# Patient Record
Sex: Female | Born: 1960 | Race: Black or African American | Hispanic: No | Marital: Married | Smoking: Never smoker
Health system: Southern US, Community
[De-identification: ages and names within clinical notes are randomized; demographics above are authoritative.]

## PROBLEM LIST (undated history)

## (undated) DIAGNOSIS — E7800 Pure hypercholesterolemia, unspecified: Secondary | ICD-10-CM

## (undated) DIAGNOSIS — I10 Essential (primary) hypertension: Secondary | ICD-10-CM

## (undated) HISTORY — PX: BREAST EXCISIONAL BIOPSY: SUR124

---

## 1997-11-02 ENCOUNTER — Encounter (HOSPITAL_COMMUNITY): Admission: RE | Admit: 1997-11-02 | Discharge: 1998-01-31 | Payer: Self-pay | Admitting: *Deleted

## 1998-01-29 ENCOUNTER — Inpatient Hospital Stay (HOSPITAL_COMMUNITY): Admission: AD | Admit: 1998-01-29 | Discharge: 1998-01-29 | Payer: Self-pay | Admitting: *Deleted

## 1998-02-01 ENCOUNTER — Encounter (HOSPITAL_COMMUNITY): Admission: RE | Admit: 1998-02-01 | Discharge: 1998-04-27 | Payer: Self-pay | Admitting: *Deleted

## 1998-03-21 ENCOUNTER — Inpatient Hospital Stay (HOSPITAL_COMMUNITY): Admission: AD | Admit: 1998-03-21 | Discharge: 1998-03-21 | Payer: Self-pay | Admitting: Obstetrics

## 1998-04-25 ENCOUNTER — Inpatient Hospital Stay (HOSPITAL_COMMUNITY): Admission: AD | Admit: 1998-04-25 | Discharge: 1998-04-29 | Payer: Self-pay | Admitting: *Deleted

## 1998-05-02 ENCOUNTER — Inpatient Hospital Stay (HOSPITAL_COMMUNITY): Admission: AD | Admit: 1998-05-02 | Discharge: 1998-05-04 | Payer: Self-pay | Admitting: *Deleted

## 1998-05-04 ENCOUNTER — Encounter (HOSPITAL_COMMUNITY): Admission: RE | Admit: 1998-05-04 | Discharge: 1998-08-02 | Payer: Self-pay | Admitting: *Deleted

## 1998-06-03 ENCOUNTER — Inpatient Hospital Stay (HOSPITAL_COMMUNITY): Admission: AD | Admit: 1998-06-03 | Discharge: 1998-06-03 | Payer: Self-pay | Admitting: Obstetrics & Gynecology

## 1998-06-21 ENCOUNTER — Inpatient Hospital Stay (HOSPITAL_COMMUNITY): Admission: AD | Admit: 1998-06-21 | Discharge: 1998-06-21 | Payer: Self-pay | Admitting: *Deleted

## 2002-09-18 ENCOUNTER — Ambulatory Visit (HOSPITAL_COMMUNITY): Admission: RE | Admit: 2002-09-18 | Discharge: 2002-09-18 | Payer: Self-pay | Admitting: Internal Medicine

## 2002-09-18 ENCOUNTER — Encounter: Payer: Self-pay | Admitting: Internal Medicine

## 2003-03-04 ENCOUNTER — Encounter: Admission: RE | Admit: 2003-03-04 | Discharge: 2003-03-04 | Payer: Self-pay | Admitting: Internal Medicine

## 2003-03-04 ENCOUNTER — Encounter: Payer: Self-pay | Admitting: Internal Medicine

## 2003-04-13 ENCOUNTER — Other Ambulatory Visit: Admission: RE | Admit: 2003-04-13 | Discharge: 2003-04-13 | Payer: Self-pay | Admitting: Obstetrics and Gynecology

## 2004-02-21 ENCOUNTER — Emergency Department (HOSPITAL_COMMUNITY): Admission: EM | Admit: 2004-02-21 | Discharge: 2004-02-21 | Payer: Self-pay | Admitting: Family Medicine

## 2004-08-14 ENCOUNTER — Encounter: Admission: RE | Admit: 2004-08-14 | Discharge: 2004-08-14 | Payer: Self-pay | Admitting: Obstetrics and Gynecology

## 2004-08-31 ENCOUNTER — Encounter: Admission: RE | Admit: 2004-08-31 | Discharge: 2004-10-16 | Payer: Self-pay | Admitting: Nurse Practitioner

## 2005-10-02 ENCOUNTER — Encounter: Admission: RE | Admit: 2005-10-02 | Discharge: 2005-10-02 | Payer: Self-pay | Admitting: Obstetrics and Gynecology

## 2007-03-20 ENCOUNTER — Encounter: Admission: RE | Admit: 2007-03-20 | Discharge: 2007-03-20 | Payer: Self-pay | Admitting: Internal Medicine

## 2007-10-19 ENCOUNTER — Emergency Department (HOSPITAL_COMMUNITY): Admission: EM | Admit: 2007-10-19 | Discharge: 2007-10-19 | Payer: Self-pay | Admitting: Emergency Medicine

## 2008-03-23 ENCOUNTER — Encounter: Admission: RE | Admit: 2008-03-23 | Discharge: 2008-03-23 | Payer: Self-pay | Admitting: Internal Medicine

## 2008-10-14 ENCOUNTER — Emergency Department (HOSPITAL_COMMUNITY): Admission: EM | Admit: 2008-10-14 | Discharge: 2008-10-14 | Payer: Self-pay | Admitting: Family Medicine

## 2008-11-05 ENCOUNTER — Ambulatory Visit (HOSPITAL_COMMUNITY): Admission: RE | Admit: 2008-11-05 | Discharge: 2008-11-05 | Payer: Self-pay | Admitting: General Surgery

## 2008-11-09 ENCOUNTER — Encounter: Admission: RE | Admit: 2008-11-09 | Discharge: 2008-11-09 | Payer: Self-pay | Admitting: Internal Medicine

## 2008-11-30 ENCOUNTER — Ambulatory Visit (HOSPITAL_COMMUNITY): Admission: RE | Admit: 2008-11-30 | Discharge: 2008-11-30 | Payer: Self-pay | Admitting: Gastroenterology

## 2008-11-30 ENCOUNTER — Encounter (INDEPENDENT_AMBULATORY_CARE_PROVIDER_SITE_OTHER): Payer: Self-pay | Admitting: Gastroenterology

## 2009-01-20 ENCOUNTER — Ambulatory Visit (HOSPITAL_COMMUNITY): Admission: RE | Admit: 2009-01-20 | Discharge: 2009-01-20 | Payer: Self-pay | Admitting: General Surgery

## 2009-01-20 ENCOUNTER — Encounter (INDEPENDENT_AMBULATORY_CARE_PROVIDER_SITE_OTHER): Payer: Self-pay | Admitting: General Surgery

## 2009-04-01 ENCOUNTER — Encounter: Admission: RE | Admit: 2009-04-01 | Discharge: 2009-04-01 | Payer: Self-pay | Admitting: Obstetrics and Gynecology

## 2009-07-26 ENCOUNTER — Ambulatory Visit (HOSPITAL_COMMUNITY): Admission: RE | Admit: 2009-07-26 | Discharge: 2009-07-26 | Payer: Self-pay | Admitting: Orthopedic Surgery

## 2009-09-05 ENCOUNTER — Encounter: Admission: RE | Admit: 2009-09-05 | Discharge: 2009-09-30 | Payer: Self-pay | Admitting: Orthopedic Surgery

## 2009-10-03 ENCOUNTER — Encounter: Admission: RE | Admit: 2009-10-03 | Discharge: 2009-10-18 | Payer: Self-pay | Admitting: Orthopedic Surgery

## 2010-04-05 ENCOUNTER — Encounter: Admission: RE | Admit: 2010-04-05 | Discharge: 2010-04-05 | Payer: Self-pay | Admitting: Obstetrics and Gynecology

## 2010-10-19 ENCOUNTER — Other Ambulatory Visit: Payer: Self-pay | Admitting: Obstetrics and Gynecology

## 2011-01-10 LAB — CBC
HCT: 35.2 % — ABNORMAL LOW (ref 36.0–46.0)
Hemoglobin: 12.2 g/dL (ref 12.0–15.0)
MCHC: 34.7 g/dL (ref 30.0–36.0)
MCV: 85.5 fL (ref 78.0–100.0)
Platelets: 295 10*3/uL (ref 150–400)
RBC: 4.12 MIL/uL (ref 3.87–5.11)
RDW: 19.2 % — ABNORMAL HIGH (ref 11.5–15.5)
WBC: 6.8 10*3/uL (ref 4.0–10.5)

## 2011-01-10 LAB — COMPREHENSIVE METABOLIC PANEL
ALT: 18 U/L (ref 0–35)
AST: 22 U/L (ref 0–37)
Albumin: 4.2 g/dL (ref 3.5–5.2)
Alkaline Phosphatase: 57 U/L (ref 39–117)
BUN: 8 mg/dL (ref 6–23)
CO2: 31 mEq/L (ref 19–32)
Calcium: 9.7 mg/dL (ref 8.4–10.5)
Chloride: 103 mEq/L (ref 96–112)
Creatinine, Ser: 0.77 mg/dL (ref 0.4–1.2)
GFR calc Af Amer: >60 mL/min (ref >60)
GFR calc non Af Amer: >60 mL/min (ref >60)
Glucose, Bld: 78 mg/dL (ref 70–99)
Potassium: 3.8 mEq/L (ref 3.5–5.1)
Sodium: 141 mEq/L (ref 135–145)
Total Bilirubin: 0.2 mg/dL — ABNORMAL LOW (ref 0.3–1.2)
Total Protein: 6.8 g/dL (ref 6.0–8.3)

## 2011-01-10 LAB — DIFFERENTIAL
Basophils Absolute: 0 10*3/uL (ref 0.0–0.1)
Basophils Relative: 1 % (ref 0–1)
Eosinophils Absolute: 0.1 10*3/uL (ref 0.0–0.7)
Eosinophils Relative: 2 % (ref 0–5)
Lymphocytes Relative: 26 % (ref 12–46)
Lymphs Abs: 1.8 10*3/uL (ref 0.7–4.0)
Monocytes Absolute: 0.4 10*3/uL (ref 0.1–1.0)
Monocytes Relative: 5 % (ref 3–12)
Neutro Abs: 4.5 10*3/uL (ref 1.7–7.7)
Neutrophils Relative %: 66 % (ref 43–77)

## 2011-01-15 LAB — COMPREHENSIVE METABOLIC PANEL
ALT: 20 U/L (ref 0–35)
AST: 26 U/L (ref 0–37)
Albumin: 4.4 g/dL (ref 3.5–5.2)
Alkaline Phosphatase: 55 U/L (ref 39–117)
BUN: 9 mg/dL (ref 6–23)
CO2: 25 mEq/L (ref 19–32)
Calcium: 9.6 mg/dL (ref 8.4–10.5)
Chloride: 108 mEq/L (ref 96–112)
Creatinine, Ser: 0.71 mg/dL (ref 0.4–1.2)
GFR calc Af Amer: >60 mL/min (ref >60)
GFR calc non Af Amer: >60 mL/min (ref >60)
Glucose, Bld: 90 mg/dL (ref 70–99)
Potassium: 3.9 mEq/L (ref 3.5–5.1)
Sodium: 140 mEq/L (ref 135–145)
Total Bilirubin: 0.7 mg/dL (ref 0.3–1.2)
Total Protein: 8 g/dL (ref 6.0–8.3)

## 2011-01-15 LAB — CBC
HCT: 29.8 % — ABNORMAL LOW (ref 36.0–46.0)
Hemoglobin: 9.6 g/dL — ABNORMAL LOW (ref 12.0–15.0)
MCHC: 32.2 g/dL (ref 30.0–36.0)
MCV: 70.1 fL — ABNORMAL LOW (ref 78.0–100.0)
Platelets: 340 10*3/uL (ref 150–400)
RBC: 4.25 MIL/uL (ref 3.87–5.11)
RDW: 17.9 % — ABNORMAL HIGH (ref 11.5–15.5)
WBC: 7.3 10*3/uL (ref 4.0–10.5)

## 2011-01-15 LAB — DIFFERENTIAL
Basophils Absolute: 0.1 10*3/uL (ref 0.0–0.1)
Basophils Relative: 1 % (ref 0–1)
Eosinophils Absolute: 0.1 10*3/uL (ref 0.0–0.7)
Eosinophils Relative: 1 % (ref 0–5)
Lymphocytes Relative: 21 % (ref 12–46)
Lymphs Abs: 1.5 10*3/uL (ref 0.7–4.0)
Monocytes Absolute: 0.5 10*3/uL (ref 0.1–1.0)
Monocytes Relative: 7 % (ref 3–12)
Neutro Abs: 5.1 10*3/uL (ref 1.7–7.7)
Neutrophils Relative %: 70 % (ref 43–77)

## 2011-01-15 LAB — POCT PREGNANCY, URINE: Preg Test, Ur: NEGATIVE

## 2011-01-15 LAB — POCT URINALYSIS DIP (DEVICE)
Glucose, UA: NEGATIVE mg/dL
Hgb urine dipstick: NEGATIVE
Ketones, ur: 15 mg/dL — AB
Nitrite: NEGATIVE
Protein, ur: 30 mg/dL — AB
Specific Gravity, Urine: 1.02 (ref 1.005–1.030)
Urobilinogen, UA: 0.2 mg/dL (ref 0.0–1.0)
pH: 6 (ref 5.0–8.0)

## 2011-01-15 LAB — LIPASE, BLOOD: Lipase: 25 U/L (ref 11–59)

## 2011-01-16 LAB — DIFFERENTIAL
Basophils Absolute: 0.1 10*3/uL (ref 0.0–0.1)
Basophils Relative: 1 % (ref 0–1)
Eosinophils Absolute: 0.1 10*3/uL (ref 0.0–0.7)
Eosinophils Relative: 1 % (ref 0–5)
Lymphocytes Relative: 30 % (ref 12–46)
Lymphs Abs: 1.7 10*3/uL (ref 0.7–4.0)
Monocytes Absolute: 0.5 10*3/uL (ref 0.1–1.0)
Monocytes Relative: 9 % (ref 3–12)
Neutro Abs: 3.4 10*3/uL (ref 1.7–7.7)
Neutrophils Relative %: 59 % (ref 43–77)

## 2011-01-16 LAB — CBC
HCT: 27 % — ABNORMAL LOW (ref 36.0–46.0)
Hemoglobin: 8.8 g/dL — ABNORMAL LOW (ref 12.0–15.0)
MCHC: 32.8 g/dL (ref 30.0–36.0)
MCV: 70.1 fL — ABNORMAL LOW (ref 78.0–100.0)
Platelets: 426 10*3/uL — ABNORMAL HIGH (ref 150–400)
RBC: 3.85 MIL/uL — ABNORMAL LOW (ref 3.87–5.11)
RDW: 19.2 % — ABNORMAL HIGH (ref 11.5–15.5)
WBC: 5.8 10*3/uL (ref 4.0–10.5)

## 2011-01-16 LAB — COMPREHENSIVE METABOLIC PANEL
ALT: 13 U/L (ref 0–35)
AST: 24 U/L (ref 0–37)
Albumin: 4 g/dL (ref 3.5–5.2)
Alkaline Phosphatase: 52 U/L (ref 39–117)
BUN: 7 mg/dL (ref 6–23)
CO2: 24 mEq/L (ref 19–32)
Calcium: 9.7 mg/dL (ref 8.4–10.5)
Chloride: 105 mEq/L (ref 96–112)
Creatinine, Ser: 0.59 mg/dL (ref 0.4–1.2)
GFR calc Af Amer: >60 mL/min (ref >60)
GFR calc non Af Amer: >60 mL/min (ref >60)
Glucose, Bld: 81 mg/dL (ref 70–99)
Potassium: 4.5 mEq/L (ref 3.5–5.1)
Sodium: 138 mEq/L (ref 135–145)
Total Bilirubin: 0.6 mg/dL (ref 0.3–1.2)
Total Protein: 7.2 g/dL (ref 6.0–8.3)

## 2011-01-16 LAB — HEMOGLOBIN AND HEMATOCRIT, BLOOD
HCT: 25.1 % — ABNORMAL LOW (ref 36.0–46.0)
Hemoglobin: 8 g/dL — ABNORMAL LOW (ref 12.0–15.0)

## 2011-02-13 NOTE — Op Note (Signed)
NAMEFARRIE, SANN               ACCOUNT NO.:  [id number]   MEDICAL RECORD NO.:  [id number]          PATIENT TYPE:  AMB   LOCATION:  ENDO                         FACILITY:  [facility]   PHYSICIAN:  Petra Kuba, M.D.    DATE OF BIRTH:  August 19, 1961   DATE OF PROCEDURE:  DATE OF DISCHARGE:                               OPERATIVE REPORT   PROCEDURE:  Colonoscopy.   INDICATION:  The patient with gallstones, microcytic anemia on preop GI  workup.  Consent was signed after risks, benefits, methods, and options  thoroughly discussed in the office prior to sedation.   MEDICINES USED:  1. Fentanyl 75 mcg.  2. Versed 8 mg.   PROCEDURE:  Rectal inspection is pertinent for external hemorrhoids,  small.  Digital exam was negative.  The video Pediatric colonoscope was  inserted, then with abdominal pressure fairly easily advanced around the  colon to the cecum.  No signs of bleeding or abnormality was seen on  insertion.  The cecum was identified by the appendiceal orifice in the  ileocecal valve.  Proctoscope was inserted short ways in the terminal  ileum, which was normal.  Through documentation was obtained.  The scope  was slowly withdrawn.  On slow withdrawal through the colon, no signs of  bleeding or abnormality were seen until we  withdrew back to the distal  sigmoid where a few tiny hyperplastic-appearing polys were seen and were  cold biopsied.  Once back in the rectum, anorectal pull-through and  retroflexion confirmed some small hemorrhoids.  Scope was drained and  readvanced towards the left side of the colon.  Air was suctioned and  scope removed.  The patient tolerated the procedure well.  There was no  obvious immediate complication.   ENDOSCOPIC DIAGNOSES:  1. Internal and external small hemorrhoids.  2. Tiny hyperplastic-appearing distal sigmoid polyp with cold biopsy.  3. Otherwise within normal limits to the terminal ileum without any      signs of bleeding.   PLAN:   Await pathology.  Based on one grandmother with colon cancer,  might repeat colon screening in 5 years and continue workup with an EGD.  Please see that dictation for further workup, plans and recommendations.           ______________________________  Petra Kuba, M.D.     MEM/MEDQ  D:  11/30/2008  T:  12/01/2008  Job:  [id number]   cc:   Merlene Laughter. Renae Gloss, M.D.  Cherylynn Ridges, M.D.

## 2011-02-13 NOTE — Op Note (Signed)
Helen Shaw, Helen Shaw               ACCOUNT NO.:  [id number]   MEDICAL RECORD NO.:  [id number]          PATIENT TYPE:  AMB   LOCATION:  SDS                          FACILITY:  [facility]   PHYSICIAN:  Cherylynn Ridges, M.D.    DATE OF BIRTH:  1961/04/02   DATE OF PROCEDURE:  01/20/2009  DATE OF DISCHARGE:  01/20/2009                               OPERATIVE REPORT   PREOPERATIVE DIAGNOSIS:  Symptomatic cholelithiasis.   POSTOPERATIVE DIAGNOSIS:  Symptomatic cholelithiasis.   PROCEDURE:  Laparoscopic cholecystectomy with cholangiogram.   SURGEON:  Cherylynn Ridges, MD   ASSISTANT:  Magnus Ivan, RNFA   ANESTHESIA:  General endotracheal.   ESTIMATED BLOOD LOSS:  Less than 10 mL.   COMPLICATIONS:  None.   CONDITION:  Stable.   FINDINGS:  Normal intraoperative cholangiogram and adhesions to the  gallbladder.   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   DESCRIPTION FOR PROCEDURE:  The patient was taken to the operating room,  placed on table in the supine position.  After an adequate general  endotracheal anesthetic was administered, she was prepped and draped in  the usual sterile manner exposing the midline in right upper quadrant.   A supraumbilical midline using was made using a #15 blade and taken down  to the midline fascia.  We grabbed the fascia with 2 Kocher clamps and  then incised between the clamps using a 15 blade into the preperitoneal  space.  We then dissected down through the preperitoneal space into the  peritoneal cavity.  Once we entered the peritoneal cavity, a pursestring  suture of 0 Vicryl was passed around the fascial opening and then a  Hasson cannula was passed into the peritoneal cavity securing it in  place.  We insufflated carbon dioxide up to a maximal intraabdominal  pressure of 50 mmHg.  We then passed 2 right costal margin 5-mm cannulas  under direct vision and a subxiphoid 5-mm cannula under direct vision.  The patient was placed in reverse Trendelenburg and the left side was  tilted down.   We were able to grasp the gallbladder and retract it towards the right  upper quadrant and the anterior abdominal wall with a ratcheted grasper  through the lateral most cannula site.  A second grabber was placed on  the infundibulum, but there were effusion to the duodenum and the  surrounding soft tissue, which we dissected away carefully using  electrocautery.   We were able to dissect out the peritoneum overlying the triangle of  Calot and hepatoduodenal triangle isolating the cystic duct and the  cystic artery.  We placed the clip along the gallbladder site of the  cystic duct and 4 clips along the cystic artery proximally and distally.  We made a cholecystodochotomy using laparoscopic scissors, then  performed a cholangiogram using a cook catheter, which had been passed  through the anterior abdominal wall.  The cholangiogram showed good  flowing to the duodenum, the proximal filling, no filling defects, no  biliary ductal  diltation.  Once the cholangiogram was completed, the  clips securing it in place was removed.  We then clipped these distal  cystic duct x3, transected the cystic duct, and then transected the  cystic artery.  We then dissected out the gallbladder from its bed with  minimal difficulty.  We used an EndoCatch bag to remove it from the  supraumbilical site.   A small amount of bilious drainage from the open gallbladder during  procedure, which was drained those minimal bleeding from the gallbladder  bed.   We irrigated with saline solution and then aspirated all fluid and gas  from above the liver and then removed all cannulas.   A 0.25% Marcaine with epinephrine was injected at all sites.  All sites  were closed with Dermabond and Tegaderm.  All needle counts, sponge  counts, and instrument counts were  correct.      Cherylynn Ridges, M.D.  Electronically Signed     JOW/MEDQ  D:  01/20/2009  T:  01/21/2009  Job:  [id number]   cc:   Petra Kuba, M.D.

## 2011-02-13 NOTE — Op Note (Signed)
NAMEJADALEE, Helen Shaw               ACCOUNT NO.:  [id number]   MEDICAL RECORD NO.:  [id number]          PATIENT TYPE:  AMB   LOCATION:  ENDO                         FACILITY:  [facility]   PHYSICIAN:  Petra Kuba, M.D.    DATE OF BIRTH:  [birth year]   DATE OF PROCEDURE:  11/30/2008  DATE OF DISCHARGE:                               OPERATIVE REPORT   PROCEDURE:  Esophagogastroduodenoscopy with biopsy.   INDICATION:  Anemia, right upper quadrant pain, and some reflux.  Consent was signed after risks, benefits, methods, and options were  thoroughly discussed prior to any sedation given.   ADDITIONAL MEDICINE FOR THIS PROCEDURE:  1. Fentanyl 25 mcg.  2. Versed 2 mg.   PROCEDURE:  The videoendoscope was inserted by direct vision.  Her  esophagus was normal.  There was no obvious hiatal hernia or signs of  esophagitis.  Scope passed into the stomach, and advanced through a  normal antrum, normal pylorus, into a normal duodenal bulb, and around  the C-loop to a normal second portion of the duodenum.  No blood was  seen distally.  We went ahead and took 2 biopsies of the duodenum to  rule out sprue or other malabsorption issues.  The scope was slowly  withdrawn back to the bulb, which again was normal.  The scope was  withdrawn back to the stomach and retroflexed.  Angularis, cardia,  fundus, lesser and greater curve were evaluated on retroflex and then  straight visualization without abnormalities.  Air was suctioned.  The  scope was slowly withdrawn.  Again a good look at the esophagus was  normal.  The scope was removed.  The patient tolerated the procedure  adequately.  There was no obvious immediate complication.   ENDOSCOPIC DIAGNOSES:  Essentially normal esophagogastroduodenoscopy  status post duodenal biopsy to rule out sprue or malabsorption of iron  issues.   PLAN:  Await pathology.  Okay with me to proceed with lap chole.  Follow  up with me in 6 weeks after lap chole to recheck  CBC, guiacs off iron,  and make sure no further workup plans are needed, but okay to proceed  with surgery when hemoglobin increases on iron.           ______________________________  Petra Kuba, M.D.     MEM/MEDQ  D:  11/30/2008  T:  12/01/2008  Job:  [id number]   cc:   Merlene Laughter. Renae Gloss, M.D.  Cherylynn Ridges, M.D.

## 2011-06-15 ENCOUNTER — Other Ambulatory Visit: Payer: Self-pay | Admitting: Internal Medicine

## 2011-06-15 DIAGNOSIS — Z1231 Encounter for screening mammogram for malignant neoplasm of breast: Secondary | ICD-10-CM

## 2011-06-20 ENCOUNTER — Ambulatory Visit: Payer: Self-pay

## 2011-06-21 ENCOUNTER — Ambulatory Visit: Payer: Self-pay

## 2011-06-28 ENCOUNTER — Ambulatory Visit
Admission: RE | Admit: 2011-06-28 | Discharge: 2011-06-28 | Disposition: A | Discharge status: Home / Self Care | Payer: 59 | Source: Ambulatory Visit | Attending: Internal Medicine | Admitting: Internal Medicine

## 2011-06-28 DIAGNOSIS — Z1231 Encounter for screening mammogram for malignant neoplasm of breast: Secondary | ICD-10-CM

## 2012-07-28 ENCOUNTER — Other Ambulatory Visit: Payer: Self-pay | Admitting: Internal Medicine

## 2012-07-28 DIAGNOSIS — Z1231 Encounter for screening mammogram for malignant neoplasm of breast: Secondary | ICD-10-CM

## 2012-08-26 ENCOUNTER — Ambulatory Visit
Admission: RE | Admit: 2012-08-26 | Discharge: 2012-08-26 | Disposition: A | Discharge status: Home / Self Care | Payer: 59 | Source: Ambulatory Visit | Attending: Internal Medicine | Admitting: Internal Medicine

## 2012-08-26 DIAGNOSIS — Z1231 Encounter for screening mammogram for malignant neoplasm of breast: Secondary | ICD-10-CM

## 2012-08-29 ENCOUNTER — Other Ambulatory Visit: Payer: Self-pay | Admitting: Internal Medicine

## 2012-08-29 DIAGNOSIS — R928 Other abnormal and inconclusive findings on diagnostic imaging of breast: Secondary | ICD-10-CM

## 2012-09-09 ENCOUNTER — Other Ambulatory Visit: Payer: 59

## 2012-09-10 ENCOUNTER — Ambulatory Visit
Admission: RE | Admit: 2012-09-10 | Discharge: 2012-09-10 | Disposition: A | Discharge status: Home / Self Care | Payer: 59 | Source: Ambulatory Visit | Attending: Internal Medicine | Admitting: Internal Medicine

## 2012-09-10 DIAGNOSIS — R928 Other abnormal and inconclusive findings on diagnostic imaging of breast: Secondary | ICD-10-CM

## 2013-08-26 ENCOUNTER — Other Ambulatory Visit: Payer: Self-pay

## 2013-08-26 DIAGNOSIS — Z1231 Encounter for screening mammogram for malignant neoplasm of breast: Secondary | ICD-10-CM

## 2013-10-07 ENCOUNTER — Ambulatory Visit
Admission: RE | Admit: 2013-10-07 | Discharge: 2013-10-07 | Disposition: A | Discharge status: Home / Self Care | Payer: 59 | Source: Ambulatory Visit

## 2013-10-07 DIAGNOSIS — Z1231 Encounter for screening mammogram for malignant neoplasm of breast: Secondary | ICD-10-CM

## 2014-01-27 ENCOUNTER — Other Ambulatory Visit: Payer: Self-pay | Admitting: Family

## 2014-01-27 DIAGNOSIS — S060XAA Concussion with loss of consciousness status unknown, initial encounter: Secondary | ICD-10-CM

## 2014-01-27 DIAGNOSIS — S060X9A Concussion with loss of consciousness of unspecified duration, initial encounter: Secondary | ICD-10-CM

## 2014-01-29 ENCOUNTER — Other Ambulatory Visit: Payer: 59

## 2014-03-19 ENCOUNTER — Other Ambulatory Visit: Payer: Self-pay | Admitting: Gastroenterology

## 2014-10-26 ENCOUNTER — Ambulatory Visit
Admission: RE | Admit: 2014-10-26 | Discharge: 2014-10-26 | Disposition: A | Discharge status: Home / Self Care | Payer: 59 | Source: Ambulatory Visit | Attending: Internal Medicine | Admitting: Internal Medicine

## 2014-10-26 ENCOUNTER — Other Ambulatory Visit: Payer: Self-pay | Admitting: Internal Medicine

## 2014-10-26 DIAGNOSIS — M25552 Pain in left hip: Principal | ICD-10-CM

## 2014-10-26 DIAGNOSIS — M25551 Pain in right hip: Secondary | ICD-10-CM

## 2014-11-23 ENCOUNTER — Other Ambulatory Visit: Payer: Self-pay

## 2014-11-23 DIAGNOSIS — Z1231 Encounter for screening mammogram for malignant neoplasm of breast: Secondary | ICD-10-CM

## 2014-11-29 ENCOUNTER — Encounter (INDEPENDENT_AMBULATORY_CARE_PROVIDER_SITE_OTHER): Payer: Self-pay

## 2014-11-29 ENCOUNTER — Ambulatory Visit
Admission: RE | Admit: 2014-11-29 | Discharge: 2014-11-29 | Disposition: A | Discharge status: Home / Self Care | Payer: 59 | Source: Ambulatory Visit

## 2014-11-29 DIAGNOSIS — Z1231 Encounter for screening mammogram for malignant neoplasm of breast: Secondary | ICD-10-CM

## 2015-10-07 MED FILL — FUSION PLUS CAPSULE: 30 days supply | Qty: 30 | Fill #3

## 2015-10-07 MED FILL — AMLODIPINE BESYLATE 2.5 MG: 2.5 | 90 days supply | Qty: 90 | Fill #3

## 2015-10-11 DIAGNOSIS — R109 Unspecified abdominal pain: Secondary | ICD-10-CM | POA: Diagnosis not present

## 2015-10-11 DIAGNOSIS — Z01419 Encounter for gynecological examination (general) (routine) without abnormal findings: Secondary | ICD-10-CM | POA: Diagnosis not present

## 2015-10-11 DIAGNOSIS — Z1151 Encounter for screening for human papillomavirus (HPV): Secondary | ICD-10-CM | POA: Diagnosis not present

## 2015-10-13 DIAGNOSIS — N914 Secondary oligomenorrhea: Secondary | ICD-10-CM | POA: Diagnosis not present

## 2015-11-07 MED FILL — FUSION PLUS CAPSULE: 30 days supply | Qty: 30 | Fill #4

## 2015-11-11 DIAGNOSIS — E559 Vitamin D deficiency, unspecified: Secondary | ICD-10-CM | POA: Diagnosis not present

## 2015-11-11 DIAGNOSIS — I10 Essential (primary) hypertension: Secondary | ICD-10-CM | POA: Diagnosis not present

## 2015-11-11 DIAGNOSIS — D649 Anemia, unspecified: Secondary | ICD-10-CM | POA: Diagnosis not present

## 2015-11-11 DIAGNOSIS — E785 Hyperlipidemia, unspecified: Secondary | ICD-10-CM | POA: Diagnosis not present

## 2015-11-11 DIAGNOSIS — J309 Allergic rhinitis, unspecified: Secondary | ICD-10-CM | POA: Diagnosis not present

## 2015-11-11 DIAGNOSIS — Z0000 Encounter for general adult medical examination without abnormal findings: Secondary | ICD-10-CM | POA: Diagnosis not present

## 2015-12-05 MED FILL — LOSARTAN POTASSIUM 100 MG T: 100 | 90 days supply | Qty: 90 | Fill #1

## 2015-12-05 MED FILL — FUSION PLUS CAPSULE: 30 days supply | Qty: 30 | Fill #5

## 2016-01-04 MED FILL — AMLODIPINE BESYLATE 2.5 MG: 2.5 | 90 days supply | Qty: 90 | Fill #0

## 2016-01-27 MED FILL — FUSION PLUS CAPSULE: 30 days supply | Qty: 30 | Fill #6

## 2016-02-17 ENCOUNTER — Other Ambulatory Visit: Payer: Self-pay

## 2016-02-17 DIAGNOSIS — Z1231 Encounter for screening mammogram for malignant neoplasm of breast: Secondary | ICD-10-CM

## 2016-03-05 ENCOUNTER — Other Ambulatory Visit: Payer: Self-pay | Admitting: Internal Medicine

## 2016-03-05 ENCOUNTER — Ambulatory Visit
Admission: RE | Admit: 2016-03-05 | Discharge: 2016-03-05 | Disposition: A | Discharge status: Home / Self Care | Payer: 59 | Source: Ambulatory Visit

## 2016-03-05 DIAGNOSIS — Z1231 Encounter for screening mammogram for malignant neoplasm of breast: Secondary | ICD-10-CM

## 2016-03-12 MED FILL — LOSARTAN POTASSIUM 100 MG T: 100 | 90 days supply | Qty: 90 | Fill #2

## 2016-03-12 MED FILL — FUSION PLUS CAPSULE: 30 days supply | Qty: 30 | Fill #7

## 2016-03-21 DIAGNOSIS — Z113 Encounter for screening for infections with a predominantly sexual mode of transmission: Secondary | ICD-10-CM | POA: Diagnosis not present

## 2016-03-21 DIAGNOSIS — Z1159 Encounter for screening for other viral diseases: Secondary | ICD-10-CM | POA: Diagnosis not present

## 2016-03-21 DIAGNOSIS — Z114 Encounter for screening for human immunodeficiency virus [HIV]: Secondary | ICD-10-CM | POA: Diagnosis not present

## 2016-03-22 DIAGNOSIS — Z113 Encounter for screening for infections with a predominantly sexual mode of transmission: Secondary | ICD-10-CM | POA: Diagnosis not present

## 2016-03-22 DIAGNOSIS — Z118 Encounter for screening for other infectious and parasitic diseases: Secondary | ICD-10-CM | POA: Diagnosis not present

## 2016-04-02 MED FILL — AMLODIPINE BESYLATE 2.5 MG: 2.5 | 90 days supply | Qty: 90 | Fill #1

## 2016-04-24 DIAGNOSIS — I10 Essential (primary) hypertension: Secondary | ICD-10-CM | POA: Diagnosis not present

## 2016-04-24 DIAGNOSIS — H2513 Age-related nuclear cataract, bilateral: Secondary | ICD-10-CM | POA: Diagnosis not present

## 2016-04-24 DIAGNOSIS — H4321 Crystalline deposits in vitreous body, right eye: Secondary | ICD-10-CM | POA: Diagnosis not present

## 2016-04-24 DIAGNOSIS — H524 Presbyopia: Secondary | ICD-10-CM | POA: Diagnosis not present

## 2016-06-01 MED FILL — FUSION PLUS CAPSULE: 30 days supply | Qty: 30 | Fill #0

## 2016-06-18 DIAGNOSIS — N959 Unspecified menopausal and perimenopausal disorder: Secondary | ICD-10-CM | POA: Diagnosis not present

## 2016-06-18 DIAGNOSIS — I10 Essential (primary) hypertension: Secondary | ICD-10-CM | POA: Diagnosis not present

## 2016-06-18 DIAGNOSIS — E785 Hyperlipidemia, unspecified: Secondary | ICD-10-CM | POA: Diagnosis not present

## 2016-06-18 DIAGNOSIS — R749 Abnormal serum enzyme level, unspecified: Secondary | ICD-10-CM | POA: Diagnosis not present

## 2016-06-19 MED FILL — LOSARTAN POTASSIUM 100 MG T: 100 | 90 days supply | Qty: 90 | Fill #0

## 2016-07-16 MED FILL — AMLODIPINE BESYLATE 2.5 MG: 2.5 | 90 days supply | Qty: 90 | Fill #2

## 2016-07-30 MED FILL — FUSION PLUS CAPSULE: 30 days supply | Qty: 30 | Fill #1

## 2016-08-15 DIAGNOSIS — R102 Pelvic and perineal pain: Secondary | ICD-10-CM | POA: Diagnosis not present

## 2016-08-15 DIAGNOSIS — N898 Other specified noninflammatory disorders of vagina: Secondary | ICD-10-CM | POA: Diagnosis not present

## 2016-08-15 MED FILL — NORETHINDRONE 0.35 MG TAB: 0.35 | 84 days supply | Qty: 84 | Fill #0

## 2016-08-29 DIAGNOSIS — I10 Essential (primary) hypertension: Secondary | ICD-10-CM | POA: Diagnosis not present

## 2016-08-29 DIAGNOSIS — E663 Overweight: Secondary | ICD-10-CM | POA: Diagnosis not present

## 2016-09-14 MED FILL — LOSARTAN POTASSIUM 100 MG T: 100 | 90 days supply | Qty: 90 | Fill #1

## 2016-10-08 MED FILL — AMLODIPINE BESYLATE 2.5 MG: 2.5 | 90 days supply | Qty: 90 | Fill #3

## 2016-10-25 MED FILL — FUSION PLUS CAPSULE: 30 days supply | Qty: 30 | Fill #2

## 2016-11-20 DIAGNOSIS — Z0000 Encounter for general adult medical examination without abnormal findings: Secondary | ICD-10-CM | POA: Diagnosis not present

## 2016-11-20 DIAGNOSIS — E785 Hyperlipidemia, unspecified: Secondary | ICD-10-CM | POA: Diagnosis not present

## 2016-11-20 DIAGNOSIS — D649 Anemia, unspecified: Secondary | ICD-10-CM | POA: Diagnosis not present

## 2016-11-20 DIAGNOSIS — R7309 Other abnormal glucose: Secondary | ICD-10-CM | POA: Diagnosis not present

## 2016-11-20 DIAGNOSIS — I10 Essential (primary) hypertension: Secondary | ICD-10-CM | POA: Diagnosis not present

## 2016-11-20 DIAGNOSIS — E559 Vitamin D deficiency, unspecified: Secondary | ICD-10-CM | POA: Diagnosis not present

## 2016-11-23 MED FILL — FUSION PLUS CAPSULE: 30 days supply | Qty: 30 | Fill #3

## 2016-11-29 DIAGNOSIS — Z1159 Encounter for screening for other viral diseases: Secondary | ICD-10-CM | POA: Diagnosis not present

## 2016-11-29 DIAGNOSIS — Z01419 Encounter for gynecological examination (general) (routine) without abnormal findings: Secondary | ICD-10-CM | POA: Diagnosis not present

## 2016-11-29 DIAGNOSIS — Z114 Encounter for screening for human immunodeficiency virus [HIV]: Secondary | ICD-10-CM | POA: Diagnosis not present

## 2016-11-29 DIAGNOSIS — Z113 Encounter for screening for infections with a predominantly sexual mode of transmission: Secondary | ICD-10-CM | POA: Diagnosis not present

## 2016-11-29 DIAGNOSIS — N951 Menopausal and female climacteric states: Secondary | ICD-10-CM | POA: Diagnosis not present

## 2016-11-29 DIAGNOSIS — Z6826 Body mass index (BMI) 26.0-26.9, adult: Secondary | ICD-10-CM | POA: Diagnosis not present

## 2016-12-25 MED FILL — LOSARTAN POTASSIUM 100 MG T: 100 | 90 days supply | Qty: 90 | Fill #2

## 2017-01-02 DIAGNOSIS — J339 Nasal polyp, unspecified: Secondary | ICD-10-CM | POA: Diagnosis not present

## 2017-01-02 MED FILL — DOXYCYCLINE HYCLATE 100 MG: 100 | 10 days supply | Qty: 20 | Fill #0

## 2017-01-21 DIAGNOSIS — I10 Essential (primary) hypertension: Secondary | ICD-10-CM | POA: Diagnosis not present

## 2017-01-21 DIAGNOSIS — J339 Nasal polyp, unspecified: Secondary | ICD-10-CM | POA: Diagnosis not present

## 2017-01-22 MED FILL — AMLODIPINE BESYLATE 2.5 MG: 2.5 | 90 days supply | Qty: 90 | Fill #0

## 2017-01-23 MED FILL — FUSION PLUS CAPSULE: 30 days supply | Qty: 30 | Fill #4

## 2017-02-01 ENCOUNTER — Other Ambulatory Visit: Payer: Self-pay | Admitting: Internal Medicine

## 2017-02-01 DIAGNOSIS — Z1231 Encounter for screening mammogram for malignant neoplasm of breast: Secondary | ICD-10-CM

## 2017-03-11 ENCOUNTER — Ambulatory Visit: Payer: 59

## 2017-03-15 MED FILL — FUSION PLUS CAPSULE: 30 days supply | Qty: 30 | Fill #5

## 2017-03-25 DIAGNOSIS — I10 Essential (primary) hypertension: Secondary | ICD-10-CM | POA: Diagnosis not present

## 2017-03-25 DIAGNOSIS — H00016 Hordeolum externum left eye, unspecified eyelid: Secondary | ICD-10-CM | POA: Diagnosis not present

## 2017-03-25 DIAGNOSIS — E663 Overweight: Secondary | ICD-10-CM | POA: Diagnosis not present

## 2017-03-26 ENCOUNTER — Ambulatory Visit
Admission: RE | Admit: 2017-03-26 | Discharge: 2017-03-26 | Disposition: A | Discharge status: Home / Self Care | Source: Ambulatory Visit | Attending: Internal Medicine | Admitting: Internal Medicine

## 2017-03-26 DIAGNOSIS — Z1231 Encounter for screening mammogram for malignant neoplasm of breast: Secondary | ICD-10-CM

## 2017-03-26 MED FILL — LOSARTAN POTASSIUM 100 MG T: 100 | 90 days supply | Qty: 90 | Fill #0

## 2017-03-26 MED FILL — TOBRADEX EYE OINTMENT: 0.3-0.1 | 10 days supply | Qty: 4 | Fill #0 | Status: TO

## 2017-04-22 MED FILL — AMLODIPINE BESYLATE 2.5 MG: 2.5 | 90 days supply | Qty: 90 | Fill #1

## 2017-05-08 MED FILL — FUSION PLUS CAPSULE: 30 days supply | Qty: 30 | Fill #6

## 2017-05-14 DIAGNOSIS — H18413 Arcus senilis, bilateral: Secondary | ICD-10-CM | POA: Diagnosis not present

## 2017-05-14 DIAGNOSIS — H4321 Crystalline deposits in vitreous body, right eye: Secondary | ICD-10-CM | POA: Diagnosis not present

## 2017-05-14 DIAGNOSIS — I10 Essential (primary) hypertension: Secondary | ICD-10-CM | POA: Diagnosis not present

## 2017-05-14 DIAGNOSIS — H2513 Age-related nuclear cataract, bilateral: Secondary | ICD-10-CM | POA: Diagnosis not present

## 2017-06-05 MED FILL — FUSION PLUS CAPSULE: 30 days supply | Qty: 30 | Fill #0

## 2017-06-27 MED FILL — NOREL AD TABLET: 4-10-325 | 10 days supply | Qty: 20 | Fill #0

## 2017-06-27 MED FILL — AZITHROMYCIN 250 MG TAB: 250 | 5 days supply | Qty: 6 | Fill #0

## 2017-06-27 MED FILL — FLUTICASONE PROP 50 MCG SPR: 50 | 30 days supply | Qty: 16 | Fill #0

## 2017-07-02 MED FILL — LOSARTAN POTASSIUM 100 MG T: 100 | 90 days supply | Qty: 90 | Fill #1

## 2017-07-29 DIAGNOSIS — L818 Other specified disorders of pigmentation: Secondary | ICD-10-CM | POA: Diagnosis not present

## 2017-08-12 MED FILL — AMLODIPINE BESYLATE 2.5 MG: 2.5 | 90 days supply | Qty: 90 | Fill #2

## 2017-08-13 MED FILL — FUSION PLUS CAPSULE: 30 days supply | Qty: 30 | Fill #1

## 2017-08-19 DIAGNOSIS — J019 Acute sinusitis, unspecified: Secondary | ICD-10-CM | POA: Diagnosis not present

## 2017-08-19 DIAGNOSIS — R749 Abnormal serum enzyme level, unspecified: Secondary | ICD-10-CM | POA: Diagnosis not present

## 2017-08-19 DIAGNOSIS — I10 Essential (primary) hypertension: Secondary | ICD-10-CM | POA: Diagnosis not present

## 2017-08-19 MED FILL — AMOXICILLIN 875 MG TABLET: 875 | 7 days supply | Qty: 14 | Fill #0

## 2017-08-19 MED FILL — predniSONE 10 MG (21) TBPK: 10 | 6 days supply | Qty: 21 | Fill #0

## 2017-10-04 MED FILL — LOSARTAN POTASSIUM 100 MG T: 100 | 90 days supply | Qty: 90 | Fill #2

## 2017-10-07 MED FILL — FUSION PLUS CAPSULE: 30 days supply | Qty: 30 | Fill #2

## 2017-10-20 DIAGNOSIS — H00015 Hordeolum externum left lower eyelid: Secondary | ICD-10-CM | POA: Diagnosis not present

## 2017-10-28 DIAGNOSIS — L814 Other melanin hyperpigmentation: Secondary | ICD-10-CM | POA: Diagnosis not present

## 2017-10-28 DIAGNOSIS — D485 Neoplasm of uncertain behavior of skin: Secondary | ICD-10-CM | POA: Diagnosis not present

## 2017-11-08 MED FILL — AMLODIPINE 2.5 MG TABLET: 2.5 | 90 days supply | Qty: 90 | Fill #3

## 2017-11-17 DIAGNOSIS — H00019 Hordeolum externum unspecified eye, unspecified eyelid: Secondary | ICD-10-CM | POA: Diagnosis not present

## 2017-11-17 DIAGNOSIS — H109 Unspecified conjunctivitis: Secondary | ICD-10-CM | POA: Diagnosis not present

## 2017-11-17 DIAGNOSIS — H5789 Other specified disorders of eye and adnexa: Secondary | ICD-10-CM | POA: Diagnosis not present

## 2017-11-21 DIAGNOSIS — H00019 Hordeolum externum unspecified eye, unspecified eyelid: Secondary | ICD-10-CM | POA: Diagnosis not present

## 2017-11-21 DIAGNOSIS — Z0000 Encounter for general adult medical examination without abnormal findings: Secondary | ICD-10-CM | POA: Diagnosis not present

## 2017-11-21 DIAGNOSIS — E559 Vitamin D deficiency, unspecified: Secondary | ICD-10-CM | POA: Diagnosis not present

## 2017-11-21 DIAGNOSIS — Z139 Encounter for screening, unspecified: Secondary | ICD-10-CM | POA: Diagnosis not present

## 2017-11-21 DIAGNOSIS — J330 Polyp of nasal cavity: Secondary | ICD-10-CM | POA: Diagnosis not present

## 2017-11-21 DIAGNOSIS — E785 Hyperlipidemia, unspecified: Secondary | ICD-10-CM | POA: Diagnosis not present

## 2017-11-21 DIAGNOSIS — R7309 Other abnormal glucose: Secondary | ICD-10-CM | POA: Diagnosis not present

## 2017-11-21 DIAGNOSIS — I10 Essential (primary) hypertension: Secondary | ICD-10-CM | POA: Diagnosis not present

## 2017-11-21 MED FILL — FLUTICASONE PROP 50 MCG SPR: 50 | 60 days supply | Qty: 16 | Fill #0

## 2017-11-21 MED FILL — TOBRAMYCIN-DEXAMETH OPTH SU: 0.3-0.1 | 12 days supply | Qty: 5 | Fill #0

## 2017-12-17 MED FILL — TOBRADEX EYE OINTMENT: 0.3-0.1 | 10 days supply | Qty: 4 | Fill #0 | Status: TO

## 2018-01-09 MED FILL — LOSARTAN POTASSIUM 100 MG T: 100 | 90 days supply | Qty: 90 | Fill #0

## 2018-01-09 MED FILL — FUSION PLUS CAPSULE: 30 days supply | Qty: 30 | Fill #3

## 2018-01-10 MED FILL — FLUTICASONE PROP 50 MCG SPR: 50 | 60 days supply | Qty: 16 | Fill #1

## 2018-01-10 MED FILL — TOBRADEX EYE OINTMENT: 0.3-0.1 | 10 days supply | Qty: 4 | Fill #0

## 2018-02-10 MED FILL — AMLODIPINE 2.5 MG TABLET: 2.5 | 90 days supply | Qty: 90 | Fill #0

## 2018-02-21 ENCOUNTER — Other Ambulatory Visit: Payer: Self-pay | Admitting: Internal Medicine

## 2018-02-21 DIAGNOSIS — Z1231 Encounter for screening mammogram for malignant neoplasm of breast: Secondary | ICD-10-CM

## 2018-03-13 DIAGNOSIS — E663 Overweight: Secondary | ICD-10-CM | POA: Diagnosis not present

## 2018-03-13 DIAGNOSIS — I10 Essential (primary) hypertension: Secondary | ICD-10-CM | POA: Diagnosis not present

## 2018-03-25 MED FILL — FUSION PLUS CAPSULE: 30 days supply | Qty: 30 | Fill #4

## 2018-03-27 ENCOUNTER — Ambulatory Visit
Admission: RE | Admit: 2018-03-27 | Discharge: 2018-03-27 | Disposition: A | Discharge status: Home / Self Care | Payer: 59 | Source: Ambulatory Visit | Attending: Internal Medicine | Admitting: Internal Medicine

## 2018-03-27 DIAGNOSIS — Z1231 Encounter for screening mammogram for malignant neoplasm of breast: Secondary | ICD-10-CM

## 2018-04-02 MED FILL — LOSARTAN POTASSIUM 100 MG T: 100 | 90 days supply | Qty: 90 | Fill #1

## 2018-04-16 DIAGNOSIS — Z1151 Encounter for screening for human papillomavirus (HPV): Secondary | ICD-10-CM | POA: Diagnosis not present

## 2018-04-16 DIAGNOSIS — Z118 Encounter for screening for other infectious and parasitic diseases: Secondary | ICD-10-CM | POA: Diagnosis not present

## 2018-04-16 DIAGNOSIS — Z114 Encounter for screening for human immunodeficiency virus [HIV]: Secondary | ICD-10-CM | POA: Diagnosis not present

## 2018-04-16 DIAGNOSIS — Z1159 Encounter for screening for other viral diseases: Secondary | ICD-10-CM | POA: Diagnosis not present

## 2018-04-16 DIAGNOSIS — Z6826 Body mass index (BMI) 26.0-26.9, adult: Secondary | ICD-10-CM | POA: Diagnosis not present

## 2018-04-16 DIAGNOSIS — Z113 Encounter for screening for infections with a predominantly sexual mode of transmission: Secondary | ICD-10-CM | POA: Diagnosis not present

## 2018-04-16 DIAGNOSIS — Z01419 Encounter for gynecological examination (general) (routine) without abnormal findings: Secondary | ICD-10-CM | POA: Diagnosis not present

## 2018-05-12 MED FILL — AMLODIPINE 2.5 MG TABLET: 2.5 | 90 days supply | Qty: 90 | Fill #1

## 2018-05-19 MED FILL — FUSION PLUS CAPSULE: 30 days supply | Qty: 30 | Fill #5

## 2018-05-20 DIAGNOSIS — H2513 Age-related nuclear cataract, bilateral: Secondary | ICD-10-CM | POA: Diagnosis not present

## 2018-05-20 DIAGNOSIS — I10 Essential (primary) hypertension: Secondary | ICD-10-CM | POA: Diagnosis not present

## 2018-05-20 DIAGNOSIS — H02834 Dermatochalasis of left upper eyelid: Secondary | ICD-10-CM | POA: Diagnosis not present

## 2018-05-20 DIAGNOSIS — H18413 Arcus senilis, bilateral: Secondary | ICD-10-CM | POA: Diagnosis not present

## 2018-05-20 DIAGNOSIS — H02831 Dermatochalasis of right upper eyelid: Secondary | ICD-10-CM | POA: Diagnosis not present

## 2018-06-05 MED FILL — FLUTICASONE PROP 50 MCG SPR: 50 | 60 days supply | Qty: 16 | Fill #2

## 2018-07-04 MED FILL — LOSARTAN POTASSIUM 100 MG T: 100 | 30 days supply | Qty: 30 | Fill #2

## 2018-07-15 DIAGNOSIS — I10 Essential (primary) hypertension: Secondary | ICD-10-CM | POA: Diagnosis not present

## 2018-07-15 DIAGNOSIS — J309 Allergic rhinitis, unspecified: Secondary | ICD-10-CM | POA: Diagnosis not present

## 2018-08-11 MED FILL — SHINGRIX 50 MCG SUS: 50 | 1 days supply | Qty: 1 | Fill #0

## 2018-08-18 MED FILL — LOSARTAN POTASSIUM 100 MG T: 100 | 30 days supply | Qty: 30 | Fill #3

## 2018-08-21 MED FILL — AMLODIPINE 2.5 MG TABLET: 2.5 | 90 days supply | Qty: 90 | Fill #2

## 2018-09-15 MED FILL — LOSARTAN POTASSIUM 100 MG T: 100 | 30 days supply | Qty: 30 | Fill #4

## 2018-10-15 MED FILL — LOSARTAN POTASSIUM 100 MG T: 100 | 30 days supply | Qty: 30 | Fill #5

## 2018-10-21 MED FILL — SHINGRIX 50 MCG SUS: 50 | 1 days supply | Qty: 1 | Fill #1

## 2018-10-28 DIAGNOSIS — D239 Other benign neoplasm of skin, unspecified: Secondary | ICD-10-CM | POA: Diagnosis not present

## 2018-10-28 DIAGNOSIS — D225 Melanocytic nevi of trunk: Secondary | ICD-10-CM | POA: Diagnosis not present

## 2018-10-28 DIAGNOSIS — L820 Inflamed seborrheic keratosis: Secondary | ICD-10-CM | POA: Diagnosis not present

## 2018-10-28 DIAGNOSIS — C44629 Squamous cell carcinoma of skin of left upper limb, including shoulder: Secondary | ICD-10-CM | POA: Diagnosis not present

## 2018-10-28 DIAGNOSIS — L814 Other melanin hyperpigmentation: Secondary | ICD-10-CM | POA: Diagnosis not present

## 2018-10-28 DIAGNOSIS — L821 Other seborrheic keratosis: Secondary | ICD-10-CM | POA: Diagnosis not present

## 2018-11-07 MED FILL — FUSION PLUS CAPSULE: 30 days supply | Qty: 30 | Fill #0

## 2018-11-11 DIAGNOSIS — E2839 Other primary ovarian failure: Secondary | ICD-10-CM | POA: Diagnosis not present

## 2018-11-11 DIAGNOSIS — E559 Vitamin D deficiency, unspecified: Secondary | ICD-10-CM | POA: Diagnosis not present

## 2018-11-11 DIAGNOSIS — I10 Essential (primary) hypertension: Secondary | ICD-10-CM | POA: Diagnosis not present

## 2018-11-11 DIAGNOSIS — Z01411 Encounter for gynecological examination (general) (routine) with abnormal findings: Secondary | ICD-10-CM | POA: Diagnosis not present

## 2018-11-11 DIAGNOSIS — R7309 Other abnormal glucose: Secondary | ICD-10-CM | POA: Diagnosis not present

## 2018-11-11 DIAGNOSIS — Z0000 Encounter for general adult medical examination without abnormal findings: Secondary | ICD-10-CM | POA: Diagnosis not present

## 2018-11-11 DIAGNOSIS — E663 Overweight: Secondary | ICD-10-CM | POA: Diagnosis not present

## 2018-11-11 DIAGNOSIS — E785 Hyperlipidemia, unspecified: Secondary | ICD-10-CM | POA: Diagnosis not present

## 2018-11-13 ENCOUNTER — Other Ambulatory Visit: Payer: Self-pay | Admitting: Internal Medicine

## 2018-11-13 DIAGNOSIS — Z1231 Encounter for screening mammogram for malignant neoplasm of breast: Secondary | ICD-10-CM

## 2018-11-17 MED FILL — LOSARTAN POTASSIUM 100 MG T: 100 | 30 days supply | Qty: 30 | Fill #6

## 2018-11-17 MED FILL — AMLODIPINE 2.5 MG TABLET: 2.5 | 90 days supply | Qty: 90 | Fill #3

## 2018-11-27 DIAGNOSIS — D0462 Carcinoma in situ of skin of left upper limb, including shoulder: Secondary | ICD-10-CM | POA: Diagnosis not present

## 2018-11-27 DIAGNOSIS — C44629 Squamous cell carcinoma of skin of left upper limb, including shoulder: Secondary | ICD-10-CM | POA: Diagnosis not present

## 2018-12-12 MED FILL — LOSARTAN POTASSIUM 100 MG T: 100 | 30 days supply | Qty: 30 | Fill #7

## 2018-12-26 MED FILL — TOBRADEX EYE OINTMENT: 0.3-0.1 | 10 days supply | Qty: 4 | Fill #0

## 2019-01-02 MED FILL — FUSION PLUS CAPSULE: 30 days supply | Qty: 30 | Fill #1

## 2019-01-05 MED FILL — LOSARTAN POTASSIUM 100 MG T: 100 | 30 days supply | Qty: 30 | Fill #0

## 2019-02-16 MED FILL — AMLODIPINE 2.5 MG TABLET: 2.5 | 90 days supply | Qty: 90 | Fill #0

## 2019-02-16 MED FILL — LOSARTAN POTASSIUM 100 MG T: 100 | 30 days supply | Qty: 30 | Fill #1

## 2019-02-19 ENCOUNTER — Other Ambulatory Visit: Payer: Self-pay | Admitting: Internal Medicine

## 2019-02-19 DIAGNOSIS — E2839 Other primary ovarian failure: Secondary | ICD-10-CM

## 2019-03-02 MED FILL — FUSION PLUS CAPSULE: 30 days supply | Qty: 30 | Fill #2

## 2019-03-16 DIAGNOSIS — Z1211 Encounter for screening for malignant neoplasm of colon: Secondary | ICD-10-CM | POA: Diagnosis not present

## 2019-03-16 DIAGNOSIS — Z800 Family history of malignant neoplasm of digestive organs: Secondary | ICD-10-CM | POA: Diagnosis not present

## 2019-03-17 MED FILL — LOSARTAN POTASSIUM 100 MG T: 100 | 30 days supply | Qty: 30 | Fill #2

## 2019-03-30 ENCOUNTER — Ambulatory Visit
Admission: RE | Admit: 2019-03-30 | Discharge: 2019-03-30 | Disposition: A | Discharge status: Home / Self Care | Payer: 59 | Source: Ambulatory Visit | Attending: Internal Medicine | Admitting: Internal Medicine

## 2019-03-30 ENCOUNTER — Other Ambulatory Visit: Payer: Self-pay

## 2019-03-30 DIAGNOSIS — Z1231 Encounter for screening mammogram for malignant neoplasm of breast: Secondary | ICD-10-CM | POA: Diagnosis not present

## 2019-04-06 MED FILL — PEG-3350 SOLUTION: 420 | 1 days supply | Qty: 4000 | Fill #0

## 2019-04-06 MED FILL — FUSION PLUS CAPSULE: 30 days supply | Qty: 30 | Fill #2

## 2019-04-13 DIAGNOSIS — D124 Benign neoplasm of descending colon: Secondary | ICD-10-CM | POA: Diagnosis not present

## 2019-04-13 DIAGNOSIS — D120 Benign neoplasm of cecum: Secondary | ICD-10-CM | POA: Diagnosis not present

## 2019-04-13 DIAGNOSIS — Z800 Family history of malignant neoplasm of digestive organs: Secondary | ICD-10-CM | POA: Diagnosis not present

## 2019-04-13 DIAGNOSIS — K635 Polyp of colon: Secondary | ICD-10-CM | POA: Diagnosis not present

## 2019-04-20 MED FILL — LOSARTAN POTASSIUM 100 MG T: 100 | 30 days supply | Qty: 30 | Fill #3

## 2019-05-04 ENCOUNTER — Ambulatory Visit
Admission: RE | Admit: 2019-05-04 | Discharge: 2019-05-04 | Disposition: A | Discharge status: Home / Self Care | Payer: 59 | Source: Ambulatory Visit | Attending: Internal Medicine | Admitting: Internal Medicine

## 2019-05-04 ENCOUNTER — Other Ambulatory Visit: Payer: Self-pay

## 2019-05-04 DIAGNOSIS — E2839 Other primary ovarian failure: Secondary | ICD-10-CM

## 2019-05-04 DIAGNOSIS — Z780 Asymptomatic menopausal state: Secondary | ICD-10-CM | POA: Diagnosis not present

## 2019-05-04 DIAGNOSIS — M85852 Other specified disorders of bone density and structure, left thigh: Secondary | ICD-10-CM | POA: Diagnosis not present

## 2019-05-14 DIAGNOSIS — I10 Essential (primary) hypertension: Secondary | ICD-10-CM | POA: Diagnosis not present

## 2019-05-14 DIAGNOSIS — Z20828 Contact with and (suspected) exposure to other viral communicable diseases: Secondary | ICD-10-CM | POA: Diagnosis not present

## 2019-05-14 DIAGNOSIS — E785 Hyperlipidemia, unspecified: Secondary | ICD-10-CM | POA: Diagnosis not present

## 2019-05-15 DIAGNOSIS — Z6823 Body mass index (BMI) 23.0-23.9, adult: Secondary | ICD-10-CM | POA: Diagnosis not present

## 2019-05-15 DIAGNOSIS — Z01419 Encounter for gynecological examination (general) (routine) without abnormal findings: Secondary | ICD-10-CM | POA: Diagnosis not present

## 2019-05-18 MED FILL — AMLODIPINE 2.5 MG TABLET: 2.5 | 90 days supply | Qty: 90 | Fill #1

## 2019-05-18 MED FILL — LOSARTAN POTASSIUM 100 MG T: 100 | 30 days supply | Qty: 30 | Fill #4

## 2019-06-09 DIAGNOSIS — N760 Acute vaginitis: Secondary | ICD-10-CM | POA: Diagnosis not present

## 2019-06-09 MED FILL — METRONIDAZOLE 500 MG TABS: 500 | 7 days supply | Qty: 14 | Fill #0

## 2019-06-23 MED FILL — LOSARTAN POTASSIUM 100 MG T: 100 | 30 days supply | Qty: 30 | Fill #5

## 2019-07-10 MED FILL — FUSION PLUS CAPSULE: 30 days supply | Qty: 30 | Fill #0

## 2019-07-21 MED FILL — LOSARTAN POTASSIUM 100 MG T: 100 | 30 days supply | Qty: 30 | Fill #6

## 2019-08-14 MED FILL — AMLODIPINE 2.5 MG TABLET: 2.5 | 90 days supply | Qty: 90 | Fill #2

## 2019-08-26 MED FILL — LOSARTAN POTASSIUM 100 MG T: 100 | 30 days supply | Qty: 30 | Fill #7

## 2019-09-19 MED FILL — LOSARTAN POTASSIUM 100 MG T: 100 | 30 days supply | Qty: 30 | Fill #8

## 2019-10-24 MED FILL — LOSARTAN POTASSIUM 100 MG T: 100 | 30 days supply | Qty: 30 | Fill #9

## 2019-11-04 DIAGNOSIS — L821 Other seborrheic keratosis: Secondary | ICD-10-CM | POA: Diagnosis not present

## 2019-11-04 DIAGNOSIS — L814 Other melanin hyperpigmentation: Secondary | ICD-10-CM | POA: Diagnosis not present

## 2019-11-04 DIAGNOSIS — D239 Other benign neoplasm of skin, unspecified: Secondary | ICD-10-CM | POA: Diagnosis not present

## 2019-11-04 DIAGNOSIS — D225 Melanocytic nevi of trunk: Secondary | ICD-10-CM | POA: Diagnosis not present

## 2019-11-04 DIAGNOSIS — L818 Other specified disorders of pigmentation: Secondary | ICD-10-CM | POA: Diagnosis not present

## 2019-11-04 DIAGNOSIS — L578 Other skin changes due to chronic exposure to nonionizing radiation: Secondary | ICD-10-CM | POA: Diagnosis not present

## 2019-11-12 DIAGNOSIS — E663 Overweight: Secondary | ICD-10-CM | POA: Diagnosis not present

## 2019-11-12 DIAGNOSIS — E785 Hyperlipidemia, unspecified: Secondary | ICD-10-CM | POA: Diagnosis not present

## 2019-11-12 DIAGNOSIS — Z20828 Contact with and (suspected) exposure to other viral communicable diseases: Secondary | ICD-10-CM | POA: Diagnosis not present

## 2019-11-12 DIAGNOSIS — E559 Vitamin D deficiency, unspecified: Secondary | ICD-10-CM | POA: Diagnosis not present

## 2019-11-12 DIAGNOSIS — Z0000 Encounter for general adult medical examination without abnormal findings: Secondary | ICD-10-CM | POA: Diagnosis not present

## 2019-11-12 DIAGNOSIS — R7309 Other abnormal glucose: Secondary | ICD-10-CM | POA: Diagnosis not present

## 2019-11-12 DIAGNOSIS — I10 Essential (primary) hypertension: Secondary | ICD-10-CM | POA: Diagnosis not present

## 2019-11-12 MED FILL — FLUTICASONE PROP 50 MCG SPR: 50 | 30 days supply | Qty: 16 | Fill #0

## 2019-11-20 MED FILL — AMLODIPINE 2.5 MG TABLET: 2.5 | 90 days supply | Qty: 90 | Fill #3

## 2019-11-20 MED FILL — LOSARTAN POTASSIUM 100 MG T: 100 | 30 days supply | Qty: 30 | Fill #10

## 2019-11-25 MED FILL — FUSION PLUS CAPSULE: 30 days supply | Qty: 30 | Fill #1

## 2019-12-26 MED FILL — LOSARTAN POTASSIUM 100 MG T: 100 | 30 days supply | Qty: 30 | Fill #11

## 2020-01-21 ENCOUNTER — Other Ambulatory Visit (HOSPITAL_COMMUNITY): Payer: Self-pay | Admitting: Internal Medicine

## 2020-01-21 MED FILL — LOSARTAN POTASSIUM 100 MG T: 100 | 90 days supply | Qty: 90 | Fill #0

## 2020-01-21 MED FILL — FUSION PLUS CAPSULE: 30 days supply | Qty: 30 | Fill #2

## 2020-02-19 MED FILL — FUSION PLUS CAPSULE: 30 days supply | Qty: 30 | Fill #3

## 2020-02-20 ENCOUNTER — Other Ambulatory Visit (HOSPITAL_COMMUNITY): Payer: Self-pay | Admitting: Internal Medicine

## 2020-02-20 MED FILL — AMLODIPINE 2.5 MG TABLET: 2.5 | 90 days supply | Qty: 90 | Fill #0

## 2020-03-15 ENCOUNTER — Other Ambulatory Visit: Payer: Self-pay | Admitting: Internal Medicine

## 2020-03-15 DIAGNOSIS — Z1231 Encounter for screening mammogram for malignant neoplasm of breast: Secondary | ICD-10-CM

## 2020-03-30 ENCOUNTER — Other Ambulatory Visit: Payer: Self-pay

## 2020-03-30 ENCOUNTER — Ambulatory Visit
Admission: RE | Admit: 2020-03-30 | Discharge: 2020-03-30 | Disposition: A | Discharge status: Home / Self Care | Payer: 59 | Source: Ambulatory Visit | Attending: Internal Medicine | Admitting: Internal Medicine

## 2020-03-30 DIAGNOSIS — Z1231 Encounter for screening mammogram for malignant neoplasm of breast: Secondary | ICD-10-CM | POA: Diagnosis not present

## 2020-04-14 MED FILL — valACYclovir HCL 1 GM TABS: 1 | 5 days supply | Qty: 5 | Fill #1

## 2020-04-22 MED FILL — LOSARTAN POTASSIUM 100 MG T: 100 | 90 days supply | Qty: 90 | Fill #1

## 2020-04-25 MED FILL — TOBRADEX EYE OINTMENT: 0.3-0.1 | 10 days supply | Qty: 4 | Fill #0 | Status: TO

## 2020-04-25 MED FILL — TOBRADEX EYE OINTMENT: 0.3-0.1 | 10 days supply | Qty: 4 | Fill #0

## 2020-04-25 MED FILL — FUSION PLUS CAPSULE: 30 days supply | Qty: 30 | Fill #4

## 2020-05-17 DIAGNOSIS — Z6826 Body mass index (BMI) 26.0-26.9, adult: Secondary | ICD-10-CM | POA: Diagnosis not present

## 2020-05-17 DIAGNOSIS — Z01419 Encounter for gynecological examination (general) (routine) without abnormal findings: Secondary | ICD-10-CM | POA: Diagnosis not present

## 2020-05-17 MED FILL — ESTRADIOL 10 MCG TABS: 10 | 28 days supply | Qty: 18 | Fill #0

## 2020-05-18 MED FILL — AMLODIPINE BESYLATE 2.5 MG: 2.5 | 90 days supply | Qty: 90 | Fill #1

## 2020-07-05 MED FILL — LOSARTAN POTASSIUM 100 MG T: 100 | 90 days supply | Qty: 90 | Fill #2

## 2020-08-09 ENCOUNTER — Other Ambulatory Visit (HOSPITAL_COMMUNITY): Payer: Self-pay | Admitting: Internal Medicine

## 2020-08-09 DIAGNOSIS — Z6827 Body mass index (BMI) 27.0-27.9, adult: Secondary | ICD-10-CM | POA: Diagnosis not present

## 2020-08-09 DIAGNOSIS — I10 Essential (primary) hypertension: Secondary | ICD-10-CM | POA: Diagnosis not present

## 2020-08-09 MED FILL — FUSION PLUS CAPSULE: 30 days supply | Qty: 30 | Fill #0

## 2020-08-09 MED FILL — AMLODIPINE BESYLATE 2.5 MG: 2.5 | 90 days supply | Qty: 90 | Fill #2

## 2020-09-08 DIAGNOSIS — H25013 Cortical age-related cataract, bilateral: Secondary | ICD-10-CM | POA: Diagnosis not present

## 2020-09-08 DIAGNOSIS — H25043 Posterior subcapsular polar age-related cataract, bilateral: Secondary | ICD-10-CM | POA: Diagnosis not present

## 2020-09-08 DIAGNOSIS — H2513 Age-related nuclear cataract, bilateral: Secondary | ICD-10-CM | POA: Diagnosis not present

## 2020-09-08 DIAGNOSIS — H18413 Arcus senilis, bilateral: Secondary | ICD-10-CM | POA: Diagnosis not present

## 2020-10-21 MED FILL — FUSION PLUS CAPSULE: 30 days supply | Qty: 30 | Fill #1

## 2020-10-21 MED FILL — LOSARTAN POTASSIUM 100 MG T: 100 | 30 days supply | Qty: 30 | Fill #3

## 2020-11-03 DIAGNOSIS — D239 Other benign neoplasm of skin, unspecified: Secondary | ICD-10-CM | POA: Diagnosis not present

## 2020-11-03 DIAGNOSIS — L814 Other melanin hyperpigmentation: Secondary | ICD-10-CM | POA: Diagnosis not present

## 2020-11-03 DIAGNOSIS — L578 Other skin changes due to chronic exposure to nonionizing radiation: Secondary | ICD-10-CM | POA: Diagnosis not present

## 2020-11-03 DIAGNOSIS — D225 Melanocytic nevi of trunk: Secondary | ICD-10-CM | POA: Diagnosis not present

## 2020-11-03 DIAGNOSIS — L821 Other seborrheic keratosis: Secondary | ICD-10-CM | POA: Diagnosis not present

## 2020-11-07 ENCOUNTER — Other Ambulatory Visit (HOSPITAL_COMMUNITY): Payer: Self-pay | Admitting: Obstetrics and Gynecology

## 2020-11-07 MED FILL — valACYclovir HCL 1 GM TABS: 1 | 5 days supply | Qty: 5 | Fill #0

## 2020-11-08 ENCOUNTER — Other Ambulatory Visit (HOSPITAL_COMMUNITY): Payer: Self-pay | Admitting: Internal Medicine

## 2020-11-08 DIAGNOSIS — E785 Hyperlipidemia, unspecified: Secondary | ICD-10-CM | POA: Diagnosis not present

## 2020-11-08 DIAGNOSIS — H6121 Impacted cerumen, right ear: Secondary | ICD-10-CM | POA: Diagnosis not present

## 2020-11-08 DIAGNOSIS — R7309 Other abnormal glucose: Secondary | ICD-10-CM | POA: Diagnosis not present

## 2020-11-08 DIAGNOSIS — E559 Vitamin D deficiency, unspecified: Secondary | ICD-10-CM | POA: Diagnosis not present

## 2020-11-08 DIAGNOSIS — Z0000 Encounter for general adult medical examination without abnormal findings: Secondary | ICD-10-CM | POA: Diagnosis not present

## 2020-11-08 DIAGNOSIS — Z01411 Encounter for gynecological examination (general) (routine) with abnormal findings: Secondary | ICD-10-CM | POA: Diagnosis not present

## 2020-11-08 DIAGNOSIS — I10 Essential (primary) hypertension: Secondary | ICD-10-CM | POA: Diagnosis not present

## 2020-11-08 DIAGNOSIS — Z6824 Body mass index (BMI) 24.0-24.9, adult: Secondary | ICD-10-CM | POA: Diagnosis not present

## 2020-11-17 ENCOUNTER — Other Ambulatory Visit (HOSPITAL_COMMUNITY): Payer: Self-pay | Admitting: Obstetrics and Gynecology

## 2020-11-17 DIAGNOSIS — Z113 Encounter for screening for infections with a predominantly sexual mode of transmission: Secondary | ICD-10-CM | POA: Diagnosis not present

## 2020-11-17 DIAGNOSIS — N898 Other specified noninflammatory disorders of vagina: Secondary | ICD-10-CM | POA: Diagnosis not present

## 2020-11-17 DIAGNOSIS — Z118 Encounter for screening for other infectious and parasitic diseases: Secondary | ICD-10-CM | POA: Diagnosis not present

## 2020-11-17 DIAGNOSIS — B373 Candidiasis of vulva and vagina: Secondary | ICD-10-CM | POA: Diagnosis not present

## 2020-11-17 DIAGNOSIS — Z1159 Encounter for screening for other viral diseases: Secondary | ICD-10-CM | POA: Diagnosis not present

## 2020-11-17 DIAGNOSIS — Z114 Encounter for screening for human immunodeficiency virus [HIV]: Secondary | ICD-10-CM | POA: Diagnosis not present

## 2020-11-17 MED FILL — FUSION PLUS CAPSULE: 30 days supply | Qty: 30 | Fill #2

## 2020-11-17 MED FILL — AMLODIPINE BESYLATE 2.5 MG: 2.5 | 90 days supply | Qty: 90 | Fill #3

## 2020-11-17 MED FILL — valACYclovir HCL 1 GM TABS: 1 | 5 days supply | Qty: 5 | Fill #1

## 2020-11-17 MED FILL — TERCONAZOLE 0.8% VAGINAL CR: 0.8 | 3 days supply | Qty: 20 | Fill #0

## 2020-11-17 MED FILL — LOSARTAN POTASSIUM 100 MG T: 100 | 30 days supply | Qty: 30 | Fill #4

## 2020-12-24 MED FILL — LOSARTAN POTASSIUM 100 MG T: 100 | 30 days supply | Qty: 30 | Fill #5

## 2020-12-26 ENCOUNTER — Emergency Department (HOSPITAL_BASED_OUTPATIENT_CLINIC_OR_DEPARTMENT_OTHER): Payer: 59

## 2020-12-26 ENCOUNTER — Emergency Department (HOSPITAL_BASED_OUTPATIENT_CLINIC_OR_DEPARTMENT_OTHER)
Admission: EM | Admit: 2020-12-26 | Discharge: 2020-12-26 | Disposition: A | Discharge status: Home / Self Care | Payer: 59 | Attending: Emergency Medicine | Admitting: Emergency Medicine

## 2020-12-26 ENCOUNTER — Encounter (HOSPITAL_BASED_OUTPATIENT_CLINIC_OR_DEPARTMENT_OTHER): Payer: Self-pay | Admitting: Emergency Medicine

## 2020-12-26 ENCOUNTER — Other Ambulatory Visit: Payer: Self-pay

## 2020-12-26 DIAGNOSIS — I10 Essential (primary) hypertension: Secondary | ICD-10-CM | POA: Diagnosis not present

## 2020-12-26 DIAGNOSIS — Y9339 Activity, other involving climbing, rappelling and jumping off: Secondary | ICD-10-CM | POA: Insufficient documentation

## 2020-12-26 DIAGNOSIS — X501XXA Overexertion from prolonged static or awkward postures, initial encounter: Secondary | ICD-10-CM | POA: Diagnosis not present

## 2020-12-26 DIAGNOSIS — T1490XA Injury, unspecified, initial encounter: Secondary | ICD-10-CM

## 2020-12-26 DIAGNOSIS — S99921A Unspecified injury of right foot, initial encounter: Secondary | ICD-10-CM | POA: Diagnosis present

## 2020-12-26 DIAGNOSIS — Z79899 Other long term (current) drug therapy: Secondary | ICD-10-CM | POA: Insufficient documentation

## 2020-12-26 DIAGNOSIS — Y9289 Other specified places as the place of occurrence of the external cause: Secondary | ICD-10-CM | POA: Insufficient documentation

## 2020-12-26 DIAGNOSIS — S92351A Displaced fracture of fifth metatarsal bone, right foot, initial encounter for closed fracture: Secondary | ICD-10-CM | POA: Insufficient documentation

## 2020-12-26 HISTORY — DX: Essential (primary) hypertension: I10

## 2020-12-26 HISTORY — DX: Pure hypercholesterolemia, unspecified: E78.00

## 2020-12-26 NOTE — ED Triage Notes (Signed)
Pt twisted right foot while exercising.  Noted swelling and pain to right side of foot.

## 2020-12-26 NOTE — Discharge Instructions (Signed)
Keep the splint in place and use crutches until you follow-up with the orthopedist.  Elevate when you can to help with pain and swelling.  Use Tylenol and ibuprofen for pain.

## 2020-12-26 NOTE — ED Provider Notes (Signed)
[facility] EMERGENCY DEPARTMENT Provider Note   CSN: [id number] Arrival date & time: 12/26/20  9326     History Chief Complaint  Patient presents with  . Foot Injury    Helen Shaw is a 60 y.o. female.  The history is provided by the patient.  Foot Injury Location:  Foot Time since incident:  1 hour Injury: yes   Mechanism of injury comment:  Was out a workout class and she was jumping up on a raised surface and then jumping down and her foot hit the edge and twisted Foot location:  R foot Pain details:    Quality:  Aching and shooting   Radiates to:  Does not radiate   Severity:  Moderate   Onset quality:  Sudden   Timing:  Constant   Progression:  Unchanged Chronicity:  New Prior injury to area:  No Relieved by:  Rest and ice Worsened by:  Bearing weight Ineffective treatments:  None tried Associated symptoms: swelling   Associated symptoms: no decreased ROM        Past Medical History:  Diagnosis Date  . Hypercholesteremia   . Hypertension     There are no problems to display for this patient.   Past Surgical History:  Procedure Laterality Date  . BREAST EXCISIONAL BIOPSY Right   . CHOLECYSTECTOMY       OB History   No obstetric history on file.     Family History  Problem Relation Age of Onset  . Breast cancer Neg Hx     Social History   Tobacco Use  . Smoking status: Never Smoker  . Smokeless tobacco: Never Used    Home Medications Prior to Admission medications   Medication Sig Start Date End Date Taking? Authorizing Provider  amLODipine (NORVASC) 2.5 MG tablet  11/17/20   [provider]  aspirin 81 MG EC tablet Adult Low Dose Aspirin 81 mg tablet,delayed release  Take 1 tablet every day by oral route.    [provider]  Cholecalciferol 50 MCG (2000 UT) TABS cholecalciferol (vitamin D3) 50 mcg (2,000 unit) tablet  1 tablet orally once a day    [provider]  Iron-FA-B  Cmp-C-Biot-Probiotic (FUSION PLUS) CAPS  08/09/20   [provider]  losartan (COZAAR) 100 MG tablet  11/17/20   [provider]  terconazole (TERAZOL 3) 0.8 % vaginal cream  11/17/20   [provider]    Allergies    No known allergies  Review of Systems   Review of Systems  All other systems reviewed and are negative.   Physical Exam Updated Vital Signs BP 126/71   Pulse 83   Temp 98.3 F (36.8 C) (Oral)   Resp 20   Ht 4\' 11"  (1.499 m)   Wt 54.9 kg   SpO2 99%   BMI 24.44 kg/m   Physical Exam Vitals and nursing note reviewed.  Constitutional:      General: She is not in acute distress.    Appearance: Normal appearance.  HENT:     Head: Normocephalic.     Mouth/Throat:     Mouth: Mucous membranes are moist.  Eyes:     Pupils: Pupils are equal, round, and reactive to light.  Cardiovascular:     Rate and Rhythm: Normal rate.     Pulses: Normal pulses.  Pulmonary:     Effort: Pulmonary effort is normal.  Musculoskeletal:        General: Tenderness and signs of injury  present.     Right ankle: Normal.       Feet:  Skin:    General: Skin is warm.  Neurological:     Mental Status: She is alert and oriented to person, place, and time. Mental status is at baseline.  Psychiatric:        Mood and Affect: Mood normal.        Behavior: Behavior normal.      ED Results / Procedures / Treatments   Labs (all labs ordered are listed, but only abnormal results are displayed) Labs Reviewed - No data to display  EKG None  Radiology DG Foot Complete Right  Result Date: 12/26/2020 CLINICAL DATA:  Pain following rolling injury EXAM: RIGHT FOOT COMPLETE - 3+ VIEW COMPARISON:  None FINDINGS: Frontal, oblique and lateral views were obtained. There is a slightly obliquely oriented fracture of the proximal aspect of the fifth metatarsal with mild separation of fracture fragments. No other fracture. No dislocation. No joint space narrowing or erosion.  IMPRESSION: Fracture proximal fifth metatarsal with mildly displaced fracture fragments. No other fractures. No dislocation. No appreciable arthropathic change. Electronically Signed   By: Lowella Grip III M.D.   On: 12/26/2020 09:55    Procedures Procedures   Medications Ordered in ED Medications - No data to display  ED Course  I have reviewed the triage vital signs and the nursing notes.  Pertinent labs & imaging results that were available during my care of the patient were reviewed by me and considered in my medical decision making (see chart for details).    MDM Rules/Calculators/A&P                          Patient is a 60 year old female presenting today after injuring her foot while exercising.  Her foot twisted and she felt a pop.  She has no ankle involvement but does have significant swelling at the base of her fifth metatarsal.  X-ray today shows a fracture of the proximal fifth metatarsal with mildly displaced fracture fragment but no evidence of dislocation.  Patient placed in a short leg splint and given crutches.  Patient is requesting to follow-up with [facility] Dr. Lucia Gaskins.  MDM Number of Diagnoses or Management Options   Amount and/or Complexity of Data Reviewed Tests in the radiology section of CPT: ordered and reviewed Independent visualization of images, tracings, or specimens: yes  Patient Progress Patient progress: stable  Final Clinical Impression(s) / ED Diagnoses Final diagnoses:  Injury  Displaced fracture of fifth metatarsal bone, right foot, initial encounter for closed fracture    Rx / DC Orders ED Discharge Orders    None       Blanchie Dessert, MD 12/26/20 1036

## 2020-12-28 DIAGNOSIS — S92301A Fracture of unspecified metatarsal bone(s), right foot, initial encounter for closed fracture: Secondary | ICD-10-CM | POA: Diagnosis not present

## 2021-01-12 ENCOUNTER — Other Ambulatory Visit (HOSPITAL_COMMUNITY): Payer: Self-pay

## 2021-01-12 MED ORDER — AMLODIPINE BESYLATE 5 MG PO TABS
ORAL_TABLET | ORAL | 3 refills | Status: AC
  Filled 2021-01-12: qty 90, 90d supply, fill #0
  Filled 2021-05-04: qty 90, 90d supply, fill #1
  Filled 2021-07-28: qty 90, 90d supply, fill #2

## 2021-01-13 ENCOUNTER — Other Ambulatory Visit (HOSPITAL_COMMUNITY): Payer: Self-pay

## 2021-01-16 ENCOUNTER — Other Ambulatory Visit (HOSPITAL_COMMUNITY): Payer: Self-pay

## 2021-01-16 MED FILL — Valacyclovir HCl Tab 1 GM: ORAL | 5 days supply | Qty: 5 | Fill #0 | Status: AC

## 2021-01-25 DIAGNOSIS — Z9889 Other specified postprocedural states: Secondary | ICD-10-CM | POA: Diagnosis not present

## 2021-01-25 DIAGNOSIS — S92301A Fracture of unspecified metatarsal bone(s), right foot, initial encounter for closed fracture: Secondary | ICD-10-CM | POA: Diagnosis not present

## 2021-01-27 ENCOUNTER — Other Ambulatory Visit (HOSPITAL_COMMUNITY): Payer: Self-pay

## 2021-01-31 ENCOUNTER — Other Ambulatory Visit (HOSPITAL_COMMUNITY): Payer: Self-pay

## 2021-01-31 MED ORDER — LOSARTAN POTASSIUM 100 MG PO TABS
1.0000 | ORAL_TABLET | Freq: Every day | ORAL | 3 refills | Status: AC
  Filled 2021-01-31: qty 90, 90d supply, fill #0
  Filled 2021-05-03: qty 90, 90d supply, fill #1
  Filled 2021-07-28: qty 90, 90d supply, fill #2

## 2021-02-02 ENCOUNTER — Other Ambulatory Visit (HOSPITAL_COMMUNITY): Payer: Self-pay

## 2021-02-02 MED ORDER — OMRON 3 SERIES BP MONITOR DEVI
0 refills | Status: AC
  Filled 2021-02-02: qty 1, 30d supply, fill #0

## 2021-02-02 MED FILL — Fe Fum-Iron Poly Cmplx-FA-B Cmplx-C-Biotin-Probiotic Cap: ORAL | 30 days supply | Qty: 30 | Fill #0 | Status: AC

## 2021-02-03 ENCOUNTER — Other Ambulatory Visit (HOSPITAL_COMMUNITY): Payer: Self-pay

## 2021-02-28 ENCOUNTER — Other Ambulatory Visit: Payer: Self-pay | Admitting: Internal Medicine

## 2021-02-28 DIAGNOSIS — Z1231 Encounter for screening mammogram for malignant neoplasm of breast: Secondary | ICD-10-CM

## 2021-03-01 DIAGNOSIS — Z9889 Other specified postprocedural states: Secondary | ICD-10-CM | POA: Diagnosis not present

## 2021-03-01 DIAGNOSIS — S92301A Fracture of unspecified metatarsal bone(s), right foot, initial encounter for closed fracture: Secondary | ICD-10-CM | POA: Diagnosis not present

## 2021-03-10 ENCOUNTER — Other Ambulatory Visit (HOSPITAL_COMMUNITY): Payer: Self-pay

## 2021-03-10 MED ORDER — PAXLOVID 20 X 150 MG & 10 X 100MG PO TBPK
ORAL_TABLET | ORAL | 0 refills | Status: AC
  Filled 2021-03-10 (×2): qty 30, 5d supply, fill #0

## 2021-03-23 ENCOUNTER — Other Ambulatory Visit (HOSPITAL_COMMUNITY): Payer: Self-pay

## 2021-03-23 DIAGNOSIS — R262 Difficulty in walking, not elsewhere classified: Secondary | ICD-10-CM | POA: Diagnosis not present

## 2021-03-23 DIAGNOSIS — M25571 Pain in right ankle and joints of right foot: Secondary | ICD-10-CM | POA: Diagnosis not present

## 2021-03-23 DIAGNOSIS — S92354D Nondisplaced fracture of fifth metatarsal bone, right foot, subsequent encounter for fracture with routine healing: Secondary | ICD-10-CM | POA: Diagnosis not present

## 2021-03-23 MED ORDER — CARESTART COVID-19 HOME TEST VI KIT
PACK | 0 refills | Status: AC
  Filled 2021-03-23: qty 4, 4d supply, fill #0

## 2021-03-28 DIAGNOSIS — M25571 Pain in right ankle and joints of right foot: Secondary | ICD-10-CM | POA: Diagnosis not present

## 2021-03-28 DIAGNOSIS — R262 Difficulty in walking, not elsewhere classified: Secondary | ICD-10-CM | POA: Diagnosis not present

## 2021-03-28 DIAGNOSIS — S92354D Nondisplaced fracture of fifth metatarsal bone, right foot, subsequent encounter for fracture with routine healing: Secondary | ICD-10-CM | POA: Diagnosis not present

## 2021-03-31 DIAGNOSIS — M25571 Pain in right ankle and joints of right foot: Secondary | ICD-10-CM | POA: Diagnosis not present

## 2021-03-31 DIAGNOSIS — R262 Difficulty in walking, not elsewhere classified: Secondary | ICD-10-CM | POA: Diagnosis not present

## 2021-03-31 DIAGNOSIS — S92354D Nondisplaced fracture of fifth metatarsal bone, right foot, subsequent encounter for fracture with routine healing: Secondary | ICD-10-CM | POA: Diagnosis not present

## 2021-04-05 DIAGNOSIS — R262 Difficulty in walking, not elsewhere classified: Secondary | ICD-10-CM | POA: Diagnosis not present

## 2021-04-05 DIAGNOSIS — S92354D Nondisplaced fracture of fifth metatarsal bone, right foot, subsequent encounter for fracture with routine healing: Secondary | ICD-10-CM | POA: Diagnosis not present

## 2021-04-05 DIAGNOSIS — M25571 Pain in right ankle and joints of right foot: Secondary | ICD-10-CM | POA: Diagnosis not present

## 2021-04-07 ENCOUNTER — Other Ambulatory Visit (HOSPITAL_COMMUNITY): Payer: Self-pay

## 2021-04-11 DIAGNOSIS — R262 Difficulty in walking, not elsewhere classified: Secondary | ICD-10-CM | POA: Diagnosis not present

## 2021-04-11 DIAGNOSIS — S92354D Nondisplaced fracture of fifth metatarsal bone, right foot, subsequent encounter for fracture with routine healing: Secondary | ICD-10-CM | POA: Diagnosis not present

## 2021-04-11 DIAGNOSIS — M25571 Pain in right ankle and joints of right foot: Secondary | ICD-10-CM | POA: Diagnosis not present

## 2021-04-13 DIAGNOSIS — S92354D Nondisplaced fracture of fifth metatarsal bone, right foot, subsequent encounter for fracture with routine healing: Secondary | ICD-10-CM | POA: Diagnosis not present

## 2021-04-13 DIAGNOSIS — R262 Difficulty in walking, not elsewhere classified: Secondary | ICD-10-CM | POA: Diagnosis not present

## 2021-04-13 DIAGNOSIS — M25571 Pain in right ankle and joints of right foot: Secondary | ICD-10-CM | POA: Diagnosis not present

## 2021-04-20 DIAGNOSIS — S92354D Nondisplaced fracture of fifth metatarsal bone, right foot, subsequent encounter for fracture with routine healing: Secondary | ICD-10-CM | POA: Diagnosis not present

## 2021-04-20 DIAGNOSIS — M25571 Pain in right ankle and joints of right foot: Secondary | ICD-10-CM | POA: Diagnosis not present

## 2021-04-20 DIAGNOSIS — R262 Difficulty in walking, not elsewhere classified: Secondary | ICD-10-CM | POA: Diagnosis not present

## 2021-04-27 ENCOUNTER — Other Ambulatory Visit: Payer: Self-pay

## 2021-04-27 ENCOUNTER — Ambulatory Visit
Admission: RE | Admit: 2021-04-27 | Discharge: 2021-04-27 | Disposition: A | Discharge status: Home / Self Care | Payer: 59 | Source: Ambulatory Visit | Attending: Internal Medicine | Admitting: Internal Medicine

## 2021-04-27 DIAGNOSIS — Z1231 Encounter for screening mammogram for malignant neoplasm of breast: Secondary | ICD-10-CM

## 2021-05-03 ENCOUNTER — Other Ambulatory Visit (HOSPITAL_COMMUNITY): Payer: Self-pay

## 2021-05-04 ENCOUNTER — Other Ambulatory Visit (HOSPITAL_COMMUNITY): Payer: Self-pay

## 2021-05-16 ENCOUNTER — Other Ambulatory Visit (HOSPITAL_COMMUNITY): Payer: Self-pay

## 2021-05-16 MED ORDER — FUSION PLUS PO CAPS
ORAL_CAPSULE | ORAL | 3 refills | Status: AC
  Filled 2021-05-16: qty 30, 30d supply, fill #0
  Filled 2021-06-10: qty 30, 30d supply, fill #1
  Filled 2022-02-07: qty 30, 30d supply, fill #2
  Filled 2022-05-14: qty 30, 30d supply, fill #3

## 2021-05-17 ENCOUNTER — Other Ambulatory Visit (HOSPITAL_COMMUNITY): Payer: Self-pay

## 2021-05-18 DIAGNOSIS — B078 Other viral warts: Secondary | ICD-10-CM | POA: Diagnosis not present

## 2021-05-19 ENCOUNTER — Other Ambulatory Visit (HOSPITAL_COMMUNITY): Payer: Self-pay

## 2021-05-23 ENCOUNTER — Other Ambulatory Visit (HOSPITAL_COMMUNITY): Payer: Self-pay

## 2021-05-23 MED ORDER — CLINDAMYCIN HCL 150 MG PO CAPS
ORAL_CAPSULE | ORAL | 1 refills | Status: AC
  Filled 2021-05-23: qty 30, 10d supply, fill #0

## 2021-06-07 ENCOUNTER — Other Ambulatory Visit (HOSPITAL_COMMUNITY): Payer: Self-pay

## 2021-06-07 MED FILL — Valacyclovir HCl Tab 1 GM: ORAL | 5 days supply | Qty: 5 | Fill #1 | Status: AC

## 2021-06-10 ENCOUNTER — Other Ambulatory Visit (HOSPITAL_COMMUNITY): Payer: Self-pay

## 2021-06-12 DIAGNOSIS — M79672 Pain in left foot: Secondary | ICD-10-CM | POA: Diagnosis not present

## 2021-06-20 DIAGNOSIS — R5383 Other fatigue: Secondary | ICD-10-CM | POA: Diagnosis not present

## 2021-06-20 DIAGNOSIS — K056 Periodontal disease, unspecified: Secondary | ICD-10-CM | POA: Diagnosis not present

## 2021-06-20 DIAGNOSIS — I10 Essential (primary) hypertension: Secondary | ICD-10-CM | POA: Diagnosis not present

## 2021-06-21 DIAGNOSIS — R5383 Other fatigue: Secondary | ICD-10-CM | POA: Diagnosis not present

## 2021-07-28 ENCOUNTER — Other Ambulatory Visit (HOSPITAL_COMMUNITY): Payer: Self-pay

## 2021-08-01 ENCOUNTER — Other Ambulatory Visit (HOSPITAL_COMMUNITY): Payer: Self-pay

## 2021-08-01 MED ORDER — AMOXICILLIN 500 MG PO CAPS
500.0000 mg | ORAL_CAPSULE | Freq: Four times a day (QID) | ORAL | 0 refills | Status: AC
  Filled 2021-08-01: qty 28, 7d supply, fill #0

## 2021-09-12 ENCOUNTER — Other Ambulatory Visit (HOSPITAL_COMMUNITY): Payer: Self-pay

## 2021-09-12 DIAGNOSIS — H25043 Posterior subcapsular polar age-related cataract, bilateral: Secondary | ICD-10-CM | POA: Diagnosis not present

## 2021-09-12 DIAGNOSIS — H2513 Age-related nuclear cataract, bilateral: Secondary | ICD-10-CM | POA: Diagnosis not present

## 2021-09-12 DIAGNOSIS — H18413 Arcus senilis, bilateral: Secondary | ICD-10-CM | POA: Diagnosis not present

## 2021-09-12 DIAGNOSIS — H25013 Cortical age-related cataract, bilateral: Secondary | ICD-10-CM | POA: Diagnosis not present

## 2021-09-12 MED ORDER — VALSARTAN-HYDROCHLOROTHIAZIDE 80-12.5 MG PO TABS
ORAL_TABLET | ORAL | 1 refills | Status: AC
  Filled 2021-09-12: qty 90, 90d supply, fill #0
  Filled 2021-11-28: qty 90, 90d supply, fill #1

## 2021-10-27 DIAGNOSIS — R7309 Other abnormal glucose: Secondary | ICD-10-CM | POA: Diagnosis not present

## 2021-10-27 DIAGNOSIS — I10 Essential (primary) hypertension: Secondary | ICD-10-CM | POA: Diagnosis not present

## 2021-10-27 DIAGNOSIS — E785 Hyperlipidemia, unspecified: Secondary | ICD-10-CM | POA: Diagnosis not present

## 2021-10-27 DIAGNOSIS — E559 Vitamin D deficiency, unspecified: Secondary | ICD-10-CM | POA: Diagnosis not present

## 2021-11-03 ENCOUNTER — Other Ambulatory Visit (HOSPITAL_COMMUNITY): Payer: Self-pay

## 2021-11-07 DIAGNOSIS — Z23 Encounter for immunization: Secondary | ICD-10-CM | POA: Diagnosis not present

## 2021-11-07 DIAGNOSIS — L814 Other melanin hyperpigmentation: Secondary | ICD-10-CM | POA: Diagnosis not present

## 2021-11-07 DIAGNOSIS — D239 Other benign neoplasm of skin, unspecified: Secondary | ICD-10-CM | POA: Diagnosis not present

## 2021-11-07 DIAGNOSIS — D225 Melanocytic nevi of trunk: Secondary | ICD-10-CM | POA: Diagnosis not present

## 2021-11-07 DIAGNOSIS — L578 Other skin changes due to chronic exposure to nonionizing radiation: Secondary | ICD-10-CM | POA: Diagnosis not present

## 2021-11-07 DIAGNOSIS — L821 Other seborrheic keratosis: Secondary | ICD-10-CM | POA: Diagnosis not present

## 2021-11-08 DIAGNOSIS — I10 Essential (primary) hypertension: Secondary | ICD-10-CM | POA: Diagnosis not present

## 2021-11-08 DIAGNOSIS — Z6827 Body mass index (BMI) 27.0-27.9, adult: Secondary | ICD-10-CM | POA: Diagnosis not present

## 2021-11-08 DIAGNOSIS — E785 Hyperlipidemia, unspecified: Secondary | ICD-10-CM | POA: Diagnosis not present

## 2021-11-08 DIAGNOSIS — E559 Vitamin D deficiency, unspecified: Secondary | ICD-10-CM | POA: Diagnosis not present

## 2021-11-08 DIAGNOSIS — Z0000 Encounter for general adult medical examination without abnormal findings: Secondary | ICD-10-CM | POA: Diagnosis not present

## 2021-11-13 DIAGNOSIS — K6289 Other specified diseases of anus and rectum: Secondary | ICD-10-CM | POA: Diagnosis not present

## 2021-11-13 DIAGNOSIS — R109 Unspecified abdominal pain: Secondary | ICD-10-CM | POA: Diagnosis not present

## 2021-11-28 ENCOUNTER — Other Ambulatory Visit (HOSPITAL_COMMUNITY): Payer: Self-pay

## 2021-12-27 ENCOUNTER — Other Ambulatory Visit (HOSPITAL_COMMUNITY): Payer: Self-pay | Admitting: Internal Medicine

## 2021-12-27 DIAGNOSIS — E785 Hyperlipidemia, unspecified: Secondary | ICD-10-CM

## 2022-01-08 ENCOUNTER — Ambulatory Visit (HOSPITAL_COMMUNITY)
Admission: RE | Admit: 2022-01-08 | Discharge: 2022-01-08 | Disposition: A | Discharge status: Home / Self Care | Payer: Self-pay | Source: Ambulatory Visit | Attending: Internal Medicine | Admitting: Internal Medicine

## 2022-01-08 DIAGNOSIS — E785 Hyperlipidemia, unspecified: Secondary | ICD-10-CM

## 2022-01-29 ENCOUNTER — Other Ambulatory Visit (HOSPITAL_COMMUNITY): Payer: Self-pay

## 2022-01-29 MED ORDER — AMOXICILLIN 500 MG PO CAPS
ORAL_CAPSULE | ORAL | 0 refills | Status: AC
Start: 2022-01-29 — End: ?
  Filled 2022-01-29: qty 28, 7d supply, fill #0

## 2022-02-07 ENCOUNTER — Other Ambulatory Visit (HOSPITAL_COMMUNITY): Payer: Self-pay

## 2022-02-08 ENCOUNTER — Other Ambulatory Visit (HOSPITAL_COMMUNITY): Payer: Self-pay

## 2022-02-13 ENCOUNTER — Other Ambulatory Visit (HOSPITAL_COMMUNITY): Payer: Self-pay

## 2022-03-21 ENCOUNTER — Other Ambulatory Visit (HOSPITAL_COMMUNITY): Payer: Self-pay

## 2022-03-21 MED ORDER — VALSARTAN-HYDROCHLOROTHIAZIDE 80-12.5 MG PO TABS
ORAL_TABLET | ORAL | 3 refills | Status: AC
  Filled 2022-03-21: qty 90, 90d supply, fill #0
  Filled 2022-06-07: qty 90, 90d supply, fill #1

## 2022-03-21 MED ORDER — VALSARTAN-HYDROCHLOROTHIAZIDE 80-12.5 MG PO TABS
1.0000 | ORAL_TABLET | Freq: Every day | ORAL | 3 refills | Status: DC
  Filled 2022-03-21 – 2022-09-25 (×2): qty 90, 90d supply, fill #0
  Filled 2023-01-07: qty 90, 90d supply, fill #1

## 2022-04-09 ENCOUNTER — Other Ambulatory Visit (HOSPITAL_COMMUNITY): Payer: Self-pay

## 2022-04-10 DIAGNOSIS — H9201 Otalgia, right ear: Secondary | ICD-10-CM | POA: Diagnosis not present

## 2022-04-10 DIAGNOSIS — I10 Essential (primary) hypertension: Secondary | ICD-10-CM | POA: Diagnosis not present

## 2022-04-10 DIAGNOSIS — M79605 Pain in left leg: Secondary | ICD-10-CM | POA: Diagnosis not present

## 2022-05-10 ENCOUNTER — Other Ambulatory Visit: Payer: Self-pay | Admitting: Internal Medicine

## 2022-05-10 DIAGNOSIS — Z1231 Encounter for screening mammogram for malignant neoplasm of breast: Secondary | ICD-10-CM

## 2022-05-29 ENCOUNTER — Ambulatory Visit
Admission: RE | Admit: 2022-05-29 | Discharge: 2022-05-29 | Disposition: A | Discharge status: Home / Self Care | Payer: 59 | Source: Ambulatory Visit | Attending: Internal Medicine | Admitting: Internal Medicine

## 2022-05-29 DIAGNOSIS — Z1231 Encounter for screening mammogram for malignant neoplasm of breast: Secondary | ICD-10-CM | POA: Diagnosis not present

## 2022-06-07 ENCOUNTER — Other Ambulatory Visit (HOSPITAL_COMMUNITY): Payer: Self-pay

## 2022-06-11 ENCOUNTER — Other Ambulatory Visit (HOSPITAL_COMMUNITY): Payer: Self-pay

## 2022-06-12 ENCOUNTER — Other Ambulatory Visit (HOSPITAL_COMMUNITY): Payer: Self-pay

## 2022-06-12 MED ORDER — FUSION PLUS PO CAPS
ORAL_CAPSULE | ORAL | 3 refills | Status: AC
  Filled 2022-06-12: qty 30, 30d supply, fill #0

## 2022-06-13 ENCOUNTER — Other Ambulatory Visit (HOSPITAL_COMMUNITY): Payer: Self-pay

## 2022-06-21 ENCOUNTER — Other Ambulatory Visit (HOSPITAL_COMMUNITY): Payer: Self-pay

## 2022-06-25 ENCOUNTER — Other Ambulatory Visit (HOSPITAL_COMMUNITY): Payer: Self-pay

## 2022-07-04 DIAGNOSIS — L918 Other hypertrophic disorders of the skin: Secondary | ICD-10-CM | POA: Diagnosis not present

## 2022-07-04 DIAGNOSIS — L820 Inflamed seborrheic keratosis: Secondary | ICD-10-CM | POA: Diagnosis not present

## 2022-08-22 ENCOUNTER — Other Ambulatory Visit (HOSPITAL_COMMUNITY): Payer: Self-pay

## 2022-09-25 ENCOUNTER — Other Ambulatory Visit (HOSPITAL_COMMUNITY): Payer: Self-pay

## 2022-11-08 DIAGNOSIS — E785 Hyperlipidemia, unspecified: Secondary | ICD-10-CM | POA: Diagnosis not present

## 2022-11-08 DIAGNOSIS — E559 Vitamin D deficiency, unspecified: Secondary | ICD-10-CM | POA: Diagnosis not present

## 2022-11-08 DIAGNOSIS — I10 Essential (primary) hypertension: Secondary | ICD-10-CM | POA: Diagnosis not present

## 2022-11-08 DIAGNOSIS — Z0001 Encounter for general adult medical examination with abnormal findings: Secondary | ICD-10-CM | POA: Diagnosis not present

## 2022-11-08 DIAGNOSIS — Z6827 Body mass index (BMI) 27.0-27.9, adult: Secondary | ICD-10-CM | POA: Diagnosis not present

## 2022-11-09 DIAGNOSIS — H18413 Arcus senilis, bilateral: Secondary | ICD-10-CM | POA: Diagnosis not present

## 2022-11-09 DIAGNOSIS — H25013 Cortical age-related cataract, bilateral: Secondary | ICD-10-CM | POA: Diagnosis not present

## 2022-11-09 DIAGNOSIS — H2513 Age-related nuclear cataract, bilateral: Secondary | ICD-10-CM | POA: Diagnosis not present

## 2022-11-09 DIAGNOSIS — H25043 Posterior subcapsular polar age-related cataract, bilateral: Secondary | ICD-10-CM | POA: Diagnosis not present

## 2023-01-07 ENCOUNTER — Other Ambulatory Visit: Payer: Self-pay

## 2023-01-23 ENCOUNTER — Other Ambulatory Visit (HOSPITAL_COMMUNITY): Payer: Self-pay

## 2023-02-13 ENCOUNTER — Other Ambulatory Visit (HOSPITAL_COMMUNITY): Payer: Self-pay

## 2023-03-06 ENCOUNTER — Other Ambulatory Visit (HOSPITAL_COMMUNITY): Payer: Self-pay

## 2023-03-07 ENCOUNTER — Other Ambulatory Visit (HOSPITAL_COMMUNITY): Payer: Self-pay

## 2023-03-07 MED ORDER — VALACYCLOVIR HCL 1 G PO TABS
500.0000 mg | ORAL_TABLET | Freq: Two times a day (BID) | ORAL | 0 refills | Status: DC | PRN
  Filled 2023-03-07: qty 6, 6d supply, fill #0

## 2023-03-30 ENCOUNTER — Other Ambulatory Visit (HOSPITAL_COMMUNITY): Payer: Self-pay

## 2023-04-01 ENCOUNTER — Other Ambulatory Visit (HOSPITAL_COMMUNITY): Payer: Self-pay

## 2023-04-01 MED ORDER — VALSARTAN-HYDROCHLOROTHIAZIDE 80-12.5 MG PO TABS
1.0000 | ORAL_TABLET | Freq: Every day | ORAL | 3 refills | Status: AC
  Filled 2023-04-01: qty 90, 90d supply, fill #0
  Filled 2023-07-09: qty 90, 90d supply, fill #1
  Filled 2023-10-19: qty 90, 90d supply, fill #2
  Filled 2024-01-16: qty 90, 90d supply, fill #3

## 2023-05-06 ENCOUNTER — Other Ambulatory Visit: Payer: Self-pay | Admitting: Internal Medicine

## 2023-05-06 DIAGNOSIS — Z1231 Encounter for screening mammogram for malignant neoplasm of breast: Secondary | ICD-10-CM

## 2023-05-24 DIAGNOSIS — Z01419 Encounter for gynecological examination (general) (routine) without abnormal findings: Secondary | ICD-10-CM | POA: Diagnosis not present

## 2023-05-24 DIAGNOSIS — Z124 Encounter for screening for malignant neoplasm of cervix: Secondary | ICD-10-CM | POA: Diagnosis not present

## 2023-05-24 DIAGNOSIS — Z01411 Encounter for gynecological examination (general) (routine) with abnormal findings: Secondary | ICD-10-CM | POA: Diagnosis not present

## 2023-05-24 DIAGNOSIS — N841 Polyp of cervix uteri: Secondary | ICD-10-CM | POA: Diagnosis not present

## 2023-05-24 DIAGNOSIS — Z113 Encounter for screening for infections with a predominantly sexual mode of transmission: Secondary | ICD-10-CM | POA: Diagnosis not present

## 2023-05-31 ENCOUNTER — Ambulatory Visit
Admission: RE | Admit: 2023-05-31 | Discharge: 2023-05-31 | Disposition: A | Discharge status: Home / Self Care | Payer: Commercial Managed Care - PPO | Source: Ambulatory Visit | Attending: Internal Medicine | Admitting: Internal Medicine

## 2023-05-31 DIAGNOSIS — Z1231 Encounter for screening mammogram for malignant neoplasm of breast: Secondary | ICD-10-CM

## 2023-06-24 ENCOUNTER — Other Ambulatory Visit: Payer: Self-pay | Admitting: Internal Medicine

## 2023-06-24 DIAGNOSIS — R600 Localized edema: Secondary | ICD-10-CM

## 2023-06-24 DIAGNOSIS — E663 Overweight: Secondary | ICD-10-CM | POA: Diagnosis not present

## 2023-06-24 DIAGNOSIS — I10 Essential (primary) hypertension: Secondary | ICD-10-CM | POA: Diagnosis not present

## 2023-06-24 DIAGNOSIS — M25562 Pain in left knee: Secondary | ICD-10-CM | POA: Diagnosis not present

## 2023-06-24 DIAGNOSIS — E785 Hyperlipidemia, unspecified: Secondary | ICD-10-CM | POA: Diagnosis not present

## 2023-06-27 ENCOUNTER — Ambulatory Visit
Admission: RE | Admit: 2023-06-27 | Discharge: 2023-06-27 | Disposition: A | Discharge status: Home / Self Care | Payer: Commercial Managed Care - PPO | Source: Ambulatory Visit | Attending: Internal Medicine | Admitting: Internal Medicine

## 2023-06-27 ENCOUNTER — Other Ambulatory Visit: Payer: Commercial Managed Care - PPO

## 2023-06-27 DIAGNOSIS — R600 Localized edema: Secondary | ICD-10-CM

## 2023-07-18 ENCOUNTER — Other Ambulatory Visit (HOSPITAL_COMMUNITY): Payer: Self-pay

## 2023-07-18 MED ORDER — VALACYCLOVIR HCL 1 G PO TABS
ORAL_TABLET | ORAL | 0 refills | Status: DC
  Filled 2023-07-18: qty 6, 6d supply, fill #0

## 2023-07-31 ENCOUNTER — Other Ambulatory Visit (HOSPITAL_COMMUNITY): Payer: Self-pay

## 2023-07-31 MED ORDER — VALACYCLOVIR HCL 1 G PO TABS
500.0000 mg | ORAL_TABLET | Freq: Two times a day (BID) | ORAL | 3 refills | Status: AC | PRN
  Filled 2023-07-31: qty 6, 6d supply, fill #0

## 2023-08-01 ENCOUNTER — Other Ambulatory Visit (HOSPITAL_COMMUNITY): Payer: Self-pay

## 2023-08-01 ENCOUNTER — Other Ambulatory Visit: Payer: Self-pay

## 2023-08-01 DIAGNOSIS — L308 Other specified dermatitis: Secondary | ICD-10-CM | POA: Diagnosis not present

## 2023-08-02 ENCOUNTER — Other Ambulatory Visit (HOSPITAL_COMMUNITY): Payer: Self-pay

## 2023-08-02 MED ORDER — KETOCONAZOLE 2 % EX CREA
TOPICAL_CREAM | CUTANEOUS | 3 refills | Status: AC
  Filled 2023-08-02: qty 60, 15d supply, fill #0

## 2023-08-05 IMAGING — CT CT CARDIAC CORONARY ARTERY CALCIUM SCORE
3 series · 14 of 20 positions shown, 15 images · non-contrast
Comparison: None.

Addendum:
CLINICAL DATA: Cardiovascular Disease Risk stratification

EXAM:
Coronary Calcium Score
TECHNIQUE: A gated, non-contrast computed tomography scan of the heart was
performed using 3mm slice thickness. Axial images were analyzed on a
dedicated workstation. Calcium scoring of the coronary arteries was
performed using the Agatston method.

[Series 3: ax ca scr 70% (id) · axial · 0.39mm/px · z∈[+1228,+1348]mm · 6 of 84 slices shown]
[im 12/84  vessel]
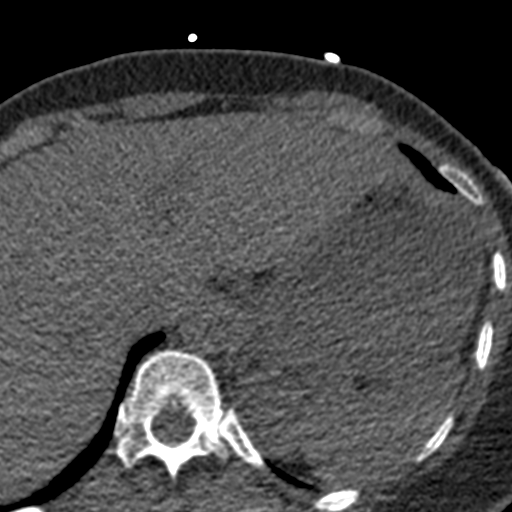
[im 24/84  vessel]
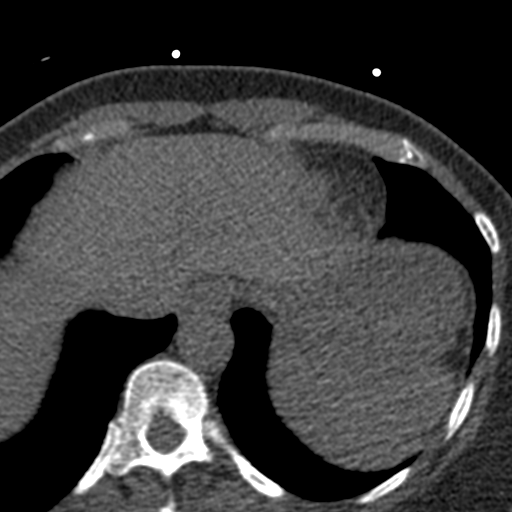
[im 36/84  vessel]
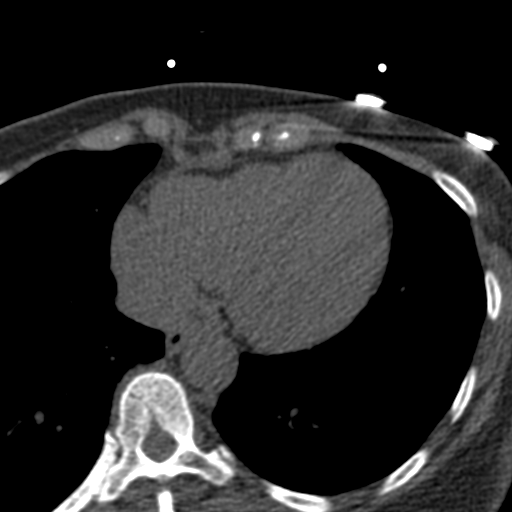
[im 48/84  vessel]
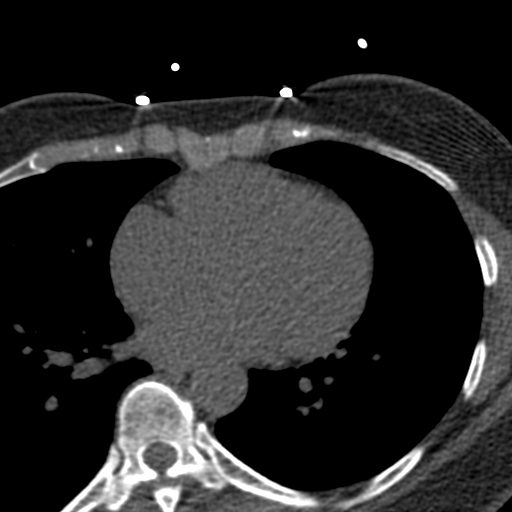
[im 60/84  vessel]
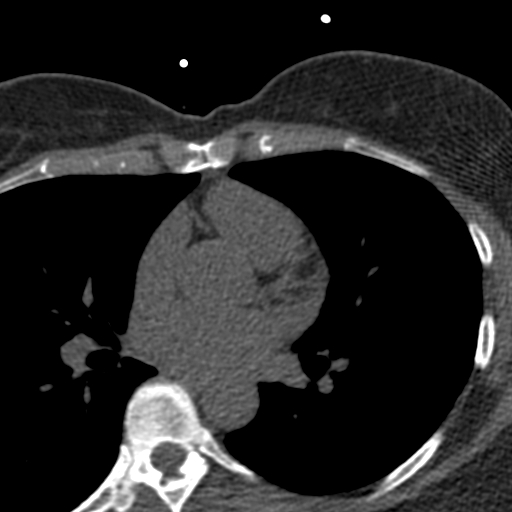
[im 72/84  vessel]
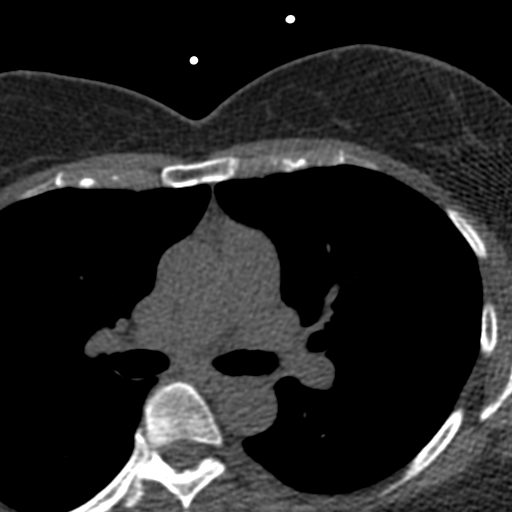

[Series 4: ax st · axial · 0.39mm/px · z∈[+1239,+1338]mm · 4 of 56 slices shown, 5 images]
[im 12/56  vessel]
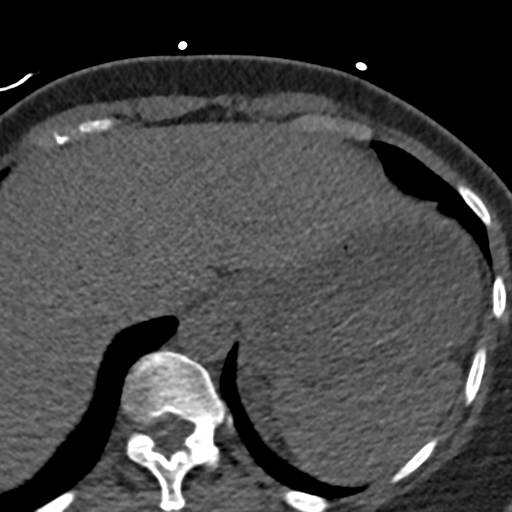
[im 12/56  lung]
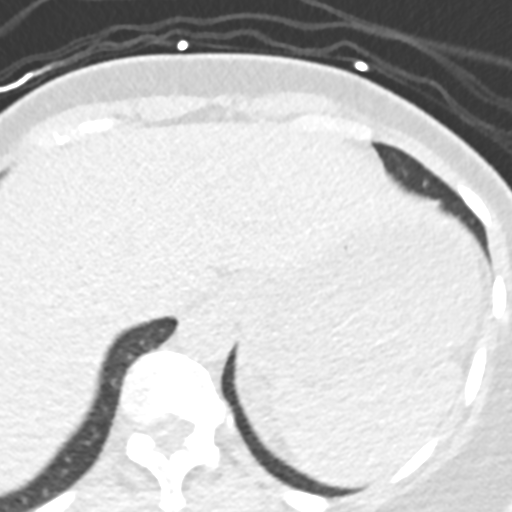
[im 23/56  vessel]
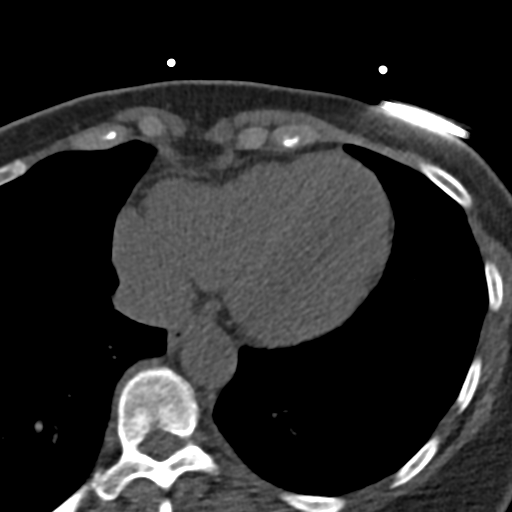
[im 34/56  vessel]
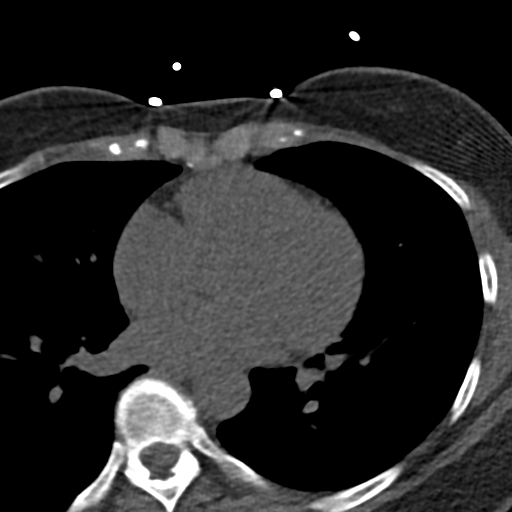
[im 45/56  vessel]
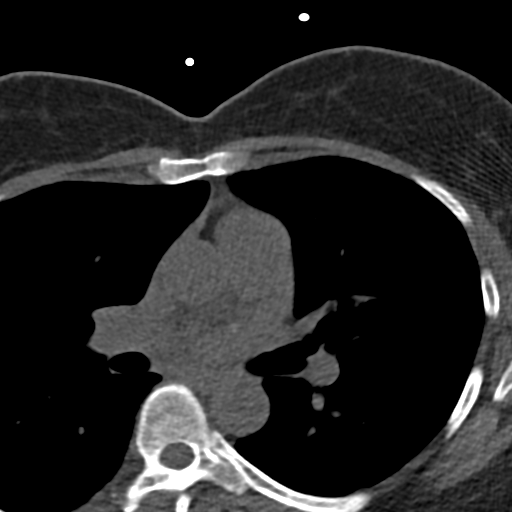

[Series 5: ax lung · axial · 0.39mm/px · z∈[+1239,+1338]mm · 4 of 56 slices shown]
[im 12/56  lung]
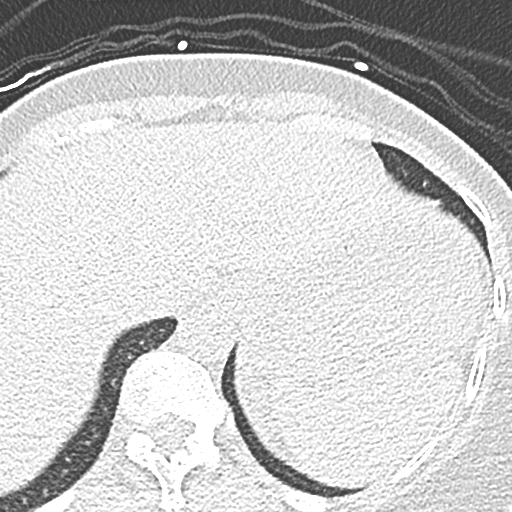
[im 23/56  lung]
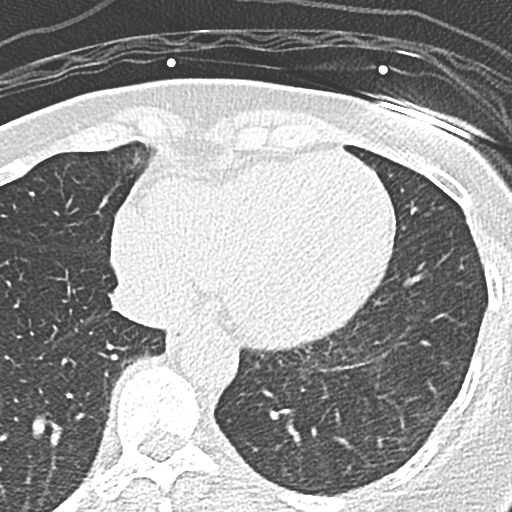
[im 34/56  lung]
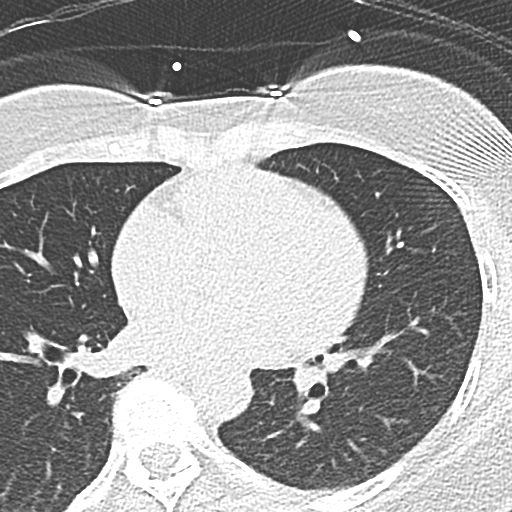
[im 45/56  lung]
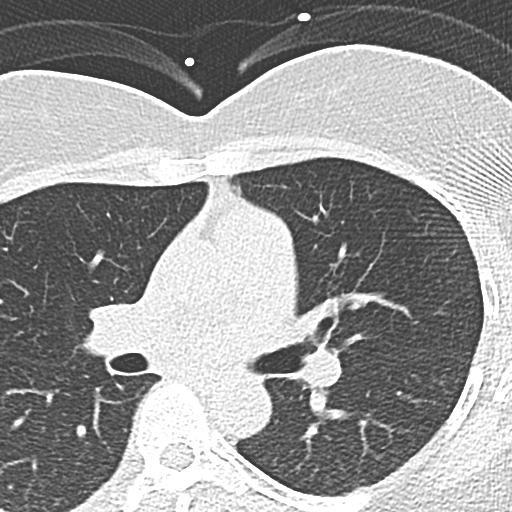

[14 of 20 positions shown; findings below may reference images not displayed]

FINDINGS: Coronary Calcium Score:

Left main: 0

Left anterior descending artery: 0

Left circumflex artery: 0

Right coronary artery: 0

Total: 0

Percentile: NA

Pericardium: Normal.

Non-cardiac: See separate report from [REDACTED].
IMPRESSION: Coronary calcium score of 0.



If CAC=0, it is reasonable to withhold statin therapy and reassess
in 5 to 10 years, as long as higher risk conditions are absent
(diabetes mellitus, family history of premature CHD in first degree
relatives (males <55 years; females <65 years), cigarette smoking,
or LDL >=190 mg/dL).

If CAC is 1 to 99, it is reasonable to initiate statin therapy for
patients >=55 years of age.

If CAC is >=100 or >=75th percentile, it is reasonable to initiate
statin therapy at any age.

Cardiology referral should be considered for patients with CAC
scores >=400 or >=75th percentile.

*7283 AHA/ACC/AACVPR/AAPA/ABC/BLAIN/TIGER/SUBIDO/Schimmozor/PAK KEONG/SAKIB/WAVE
Guideline on the Management of Blood Cholesterol: A Report of the
American College of Cardiology/American Heart Association Task Force
on Clinical Practice Guidelines. J Am Coll Cardiol.
7897;73(24):4613-4424.

EXAM:
OVER-READ INTERPRETATION  CT CHEST

The following report is an over-read performed by radiologist Dr.
over-read does not include interpretation of cardiac or coronary
anatomy or pathology. The coronary calcium score interpretation by
the cardiologist is attached.
FINDINGS: Cardiovascular: There are no significant vascular findings. The
heart size is normal. There is no pericardial effusion. Mild
atherosclerotic calcification of the aortic arch.

Mediastinum/Nodes: No visualized enlarged lymph nodes.The visualized
esophagus demonstrate no significant findings.

Lungs/Pleura: Clear lungs. No pneumothorax or pleural effusion.

Upper abdomen: No acute abnormality.

Musculoskeletal/Chest wall: No chest wall abnormality. No acute or
significant osseous findings.
IMPRESSION: 1. No significant extracardiac findings.
2. Aortic Atherosclerosis (FDKG3-QLC.C).

*** End of Addendum ***
FINDINGS: Coronary Calcium Score:

Left main: 0

Left anterior descending artery: 0

Left circumflex artery: 0

Right coronary artery: 0

Total: 0

Percentile: NA

Pericardium: Normal.

Non-cardiac: See separate report from [REDACTED].
IMPRESSION: Coronary calcium score of 0.



If CAC=0, it is reasonable to withhold statin therapy and reassess
in 5 to 10 years, as long as higher risk conditions are absent
(diabetes mellitus, family history of premature CHD in first degree
relatives (males <55 years; females <65 years), cigarette smoking,
or LDL >=190 mg/dL).

If CAC is 1 to 99, it is reasonable to initiate statin therapy for
patients >=55 years of age.

If CAC is >=100 or >=75th percentile, it is reasonable to initiate
statin therapy at any age.

Cardiology referral should be considered for patients with CAC
scores >=400 or >=75th percentile.

*7283 AHA/ACC/AACVPR/AAPA/ABC/BLAIN/TIGER/SUBIDO/Schimmozor/PAK KEONG/SAKIB/WAVE
Guideline on the Management of Blood Cholesterol: A Report of the
American College of Cardiology/American Heart Association Task Force
on Clinical Practice Guidelines. J Am Coll Cardiol.
7897;73(24):4613-4424.

## 2023-08-20 DIAGNOSIS — D225 Melanocytic nevi of trunk: Secondary | ICD-10-CM | POA: Diagnosis not present

## 2023-08-20 DIAGNOSIS — Z1283 Encounter for screening for malignant neoplasm of skin: Secondary | ICD-10-CM | POA: Diagnosis not present

## 2023-08-23 DIAGNOSIS — M25562 Pain in left knee: Secondary | ICD-10-CM | POA: Diagnosis not present

## 2023-09-16 DIAGNOSIS — M25562 Pain in left knee: Secondary | ICD-10-CM | POA: Diagnosis not present

## 2023-10-09 DIAGNOSIS — M25562 Pain in left knee: Secondary | ICD-10-CM | POA: Diagnosis not present

## 2023-10-10 ENCOUNTER — Other Ambulatory Visit (HOSPITAL_COMMUNITY): Payer: Self-pay

## 2023-10-10 MED ORDER — MELOXICAM 15 MG PO TABS
15.0000 mg | ORAL_TABLET | Freq: Every day | ORAL | 3 refills | Status: AC | PRN
  Filled 2023-10-10: qty 30, 30d supply, fill #0

## 2023-10-11 ENCOUNTER — Other Ambulatory Visit (HOSPITAL_COMMUNITY): Payer: Self-pay

## 2023-11-19 ENCOUNTER — Other Ambulatory Visit (HOSPITAL_COMMUNITY): Payer: Self-pay

## 2023-12-16 ENCOUNTER — Other Ambulatory Visit (HOSPITAL_COMMUNITY): Payer: Self-pay

## 2023-12-16 DIAGNOSIS — I10 Essential (primary) hypertension: Secondary | ICD-10-CM | POA: Diagnosis not present

## 2023-12-16 DIAGNOSIS — Z0001 Encounter for general adult medical examination with abnormal findings: Secondary | ICD-10-CM | POA: Diagnosis not present

## 2023-12-16 DIAGNOSIS — Z6827 Body mass index (BMI) 27.0-27.9, adult: Secondary | ICD-10-CM | POA: Diagnosis not present

## 2023-12-16 DIAGNOSIS — E559 Vitamin D deficiency, unspecified: Secondary | ICD-10-CM | POA: Diagnosis not present

## 2023-12-16 DIAGNOSIS — E785 Hyperlipidemia, unspecified: Secondary | ICD-10-CM | POA: Diagnosis not present

## 2023-12-30 ENCOUNTER — Other Ambulatory Visit (HOSPITAL_COMMUNITY): Payer: Self-pay

## 2023-12-30 DIAGNOSIS — J029 Acute pharyngitis, unspecified: Secondary | ICD-10-CM | POA: Diagnosis not present

## 2023-12-30 MED ORDER — AMOXICILLIN 875 MG PO TABS
875.0000 mg | ORAL_TABLET | Freq: Two times a day (BID) | ORAL | 0 refills | Status: DC
  Filled 2023-12-30: qty 14, 7d supply, fill #0

## 2024-01-16 ENCOUNTER — Other Ambulatory Visit (HOSPITAL_COMMUNITY): Payer: Self-pay

## 2024-01-16 DIAGNOSIS — I10 Essential (primary) hypertension: Secondary | ICD-10-CM | POA: Diagnosis not present

## 2024-01-16 DIAGNOSIS — J029 Acute pharyngitis, unspecified: Secondary | ICD-10-CM | POA: Diagnosis not present

## 2024-01-16 DIAGNOSIS — Z789 Other specified health status: Secondary | ICD-10-CM | POA: Diagnosis not present

## 2024-01-16 MED ORDER — AZITHROMYCIN 250 MG PO TABS
ORAL_TABLET | ORAL | 0 refills | Status: DC
  Filled 2024-01-16: qty 6, 5d supply, fill #0

## 2024-01-16 MED ORDER — METHYLPREDNISOLONE 4 MG PO TBPK
ORAL_TABLET | ORAL | 0 refills | Status: DC
  Filled 2024-01-16: qty 21, 6d supply, fill #0

## 2024-04-08 ENCOUNTER — Other Ambulatory Visit (HOSPITAL_COMMUNITY): Payer: Self-pay

## 2024-04-08 DIAGNOSIS — M7661 Achilles tendinitis, right leg: Secondary | ICD-10-CM | POA: Diagnosis not present

## 2024-04-08 MED ORDER — MELOXICAM 15 MG PO TABS
15.0000 mg | ORAL_TABLET | Freq: Every day | ORAL | 1 refills | Status: AC
  Filled 2024-04-08: qty 30, 30d supply, fill #0

## 2024-04-09 ENCOUNTER — Other Ambulatory Visit (HOSPITAL_COMMUNITY): Payer: Self-pay

## 2024-04-19 ENCOUNTER — Other Ambulatory Visit (HOSPITAL_COMMUNITY): Payer: Self-pay

## 2024-04-20 ENCOUNTER — Other Ambulatory Visit (HOSPITAL_COMMUNITY): Payer: Self-pay

## 2024-04-22 ENCOUNTER — Other Ambulatory Visit (HOSPITAL_COMMUNITY): Payer: Self-pay

## 2024-04-23 ENCOUNTER — Other Ambulatory Visit (HOSPITAL_BASED_OUTPATIENT_CLINIC_OR_DEPARTMENT_OTHER): Payer: Self-pay

## 2024-04-23 MED ORDER — PEG-3350/ELECTROLYTES 236 G PO SOLR
ORAL | 0 refills | Status: AC
  Filled 2024-04-23 (×2): qty 4000, 7d supply, fill #0

## 2024-04-23 MED ORDER — BISACODYL EC 5 MG PO TBEC
DELAYED_RELEASE_TABLET | ORAL | 0 refills | Status: AC
  Filled 2024-04-23: qty 25, 30d supply, fill #0

## 2024-04-24 ENCOUNTER — Other Ambulatory Visit (HOSPITAL_COMMUNITY): Payer: Self-pay

## 2024-04-24 MED ORDER — VALSARTAN-HYDROCHLOROTHIAZIDE 80-12.5 MG PO TABS
1.0000 | ORAL_TABLET | Freq: Every day | ORAL | 3 refills | Status: AC
  Filled 2024-04-24: qty 90, 90d supply, fill #0
  Filled 2024-07-27: qty 90, 90d supply, fill #1
  Filled 2024-11-04: qty 90, 90d supply, fill #2

## 2024-04-27 ENCOUNTER — Other Ambulatory Visit (HOSPITAL_COMMUNITY): Payer: Self-pay

## 2024-04-27 DIAGNOSIS — K635 Polyp of colon: Secondary | ICD-10-CM | POA: Diagnosis not present

## 2024-04-27 DIAGNOSIS — Z860101 Personal history of adenomatous and serrated colon polyps: Secondary | ICD-10-CM | POA: Diagnosis not present

## 2024-04-27 DIAGNOSIS — Z09 Encounter for follow-up examination after completed treatment for conditions other than malignant neoplasm: Secondary | ICD-10-CM | POA: Diagnosis not present

## 2024-04-27 MED ORDER — VALSARTAN-HYDROCHLOROTHIAZIDE 80-12.5 MG PO TABS
1.0000 | ORAL_TABLET | Freq: Every day | ORAL | 3 refills | Status: AC
  Filled 2024-04-27: qty 90, 90d supply, fill #0

## 2024-04-30 ENCOUNTER — Other Ambulatory Visit: Payer: Self-pay | Admitting: Internal Medicine

## 2024-04-30 DIAGNOSIS — Z1231 Encounter for screening mammogram for malignant neoplasm of breast: Secondary | ICD-10-CM

## 2024-05-14 ENCOUNTER — Other Ambulatory Visit (HOSPITAL_COMMUNITY): Payer: Self-pay

## 2024-05-19 DIAGNOSIS — H25043 Posterior subcapsular polar age-related cataract, bilateral: Secondary | ICD-10-CM | POA: Diagnosis not present

## 2024-05-19 DIAGNOSIS — H25013 Cortical age-related cataract, bilateral: Secondary | ICD-10-CM | POA: Diagnosis not present

## 2024-05-19 DIAGNOSIS — H2513 Age-related nuclear cataract, bilateral: Secondary | ICD-10-CM | POA: Diagnosis not present

## 2024-05-19 DIAGNOSIS — H18413 Arcus senilis, bilateral: Secondary | ICD-10-CM | POA: Diagnosis not present

## 2024-06-03 ENCOUNTER — Ambulatory Visit
Admission: RE | Admit: 2024-06-03 | Discharge: 2024-06-03 | Disposition: A | Discharge status: Home / Self Care | Source: Ambulatory Visit | Attending: Internal Medicine | Admitting: Internal Medicine

## 2024-06-03 DIAGNOSIS — Z1231 Encounter for screening mammogram for malignant neoplasm of breast: Secondary | ICD-10-CM

## 2024-06-30 ENCOUNTER — Other Ambulatory Visit (HOSPITAL_COMMUNITY): Payer: Self-pay

## 2024-07-16 DIAGNOSIS — Z01419 Encounter for gynecological examination (general) (routine) without abnormal findings: Secondary | ICD-10-CM | POA: Diagnosis not present

## 2024-07-16 DIAGNOSIS — Z1331 Encounter for screening for depression: Secondary | ICD-10-CM | POA: Diagnosis not present

## 2024-08-05 DIAGNOSIS — Z08 Encounter for follow-up examination after completed treatment for malignant neoplasm: Secondary | ICD-10-CM | POA: Diagnosis not present

## 2024-08-05 DIAGNOSIS — Z1283 Encounter for screening for malignant neoplasm of skin: Secondary | ICD-10-CM | POA: Diagnosis not present

## 2024-08-05 DIAGNOSIS — L820 Inflamed seborrheic keratosis: Secondary | ICD-10-CM | POA: Diagnosis not present

## 2024-08-05 DIAGNOSIS — D225 Melanocytic nevi of trunk: Secondary | ICD-10-CM | POA: Diagnosis not present

## 2024-08-05 DIAGNOSIS — Z85828 Personal history of other malignant neoplasm of skin: Secondary | ICD-10-CM | POA: Diagnosis not present

## 2024-08-21 ENCOUNTER — Other Ambulatory Visit (HOSPITAL_COMMUNITY): Payer: Self-pay
